# Patient Record
Sex: Female | Born: 1965 | Race: White | Hispanic: No | Marital: Married | Smoking: Never smoker
Health system: Southern US, Community
[De-identification: ages and names within clinical notes are randomized; demographics above are authoritative.]

## PROBLEM LIST (undated history)

## (undated) DIAGNOSIS — E785 Hyperlipidemia, unspecified: Secondary | ICD-10-CM

## (undated) DIAGNOSIS — I10 Essential (primary) hypertension: Secondary | ICD-10-CM

## (undated) DIAGNOSIS — N289 Disorder of kidney and ureter, unspecified: Secondary | ICD-10-CM

## (undated) DIAGNOSIS — F419 Anxiety disorder, unspecified: Secondary | ICD-10-CM

## (undated) HISTORY — PX: LITHOTRIPSY: SUR834

## (undated) HISTORY — DX: Hyperlipidemia, unspecified: E78.5

## (undated) HISTORY — DX: Anxiety disorder, unspecified: F41.9

## (undated) HISTORY — PX: BREAST REDUCTION SURGERY: SHX8

## (undated) HISTORY — PX: ENDOMETRIAL ABLATION: SHX621

## (undated) HISTORY — PX: NASAL SEPTUM SURGERY: SHX37

## (undated) HISTORY — PX: UPPER GASTROINTESTINAL ENDOSCOPY: SHX188

---

## 1998-06-21 ENCOUNTER — Inpatient Hospital Stay (HOSPITAL_COMMUNITY): Admission: AD | Admit: 1998-06-21 | Discharge: 1998-06-21 | Payer: Self-pay | Admitting: *Deleted

## 1998-06-25 ENCOUNTER — Observation Stay (HOSPITAL_COMMUNITY): Admission: AD | Admit: 1998-06-25 | Discharge: 1998-06-25 | Payer: Self-pay | Admitting: *Deleted

## 1998-08-01 ENCOUNTER — Inpatient Hospital Stay (HOSPITAL_COMMUNITY): Admission: AD | Admit: 1998-08-01 | Discharge: 1998-08-01 | Payer: Self-pay | Admitting: Obstetrics and Gynecology

## 1998-08-07 ENCOUNTER — Inpatient Hospital Stay (HOSPITAL_COMMUNITY): Admission: AD | Admit: 1998-08-07 | Discharge: 1998-08-07 | Payer: Self-pay | Admitting: Obstetrics and Gynecology

## 1998-08-24 ENCOUNTER — Inpatient Hospital Stay (HOSPITAL_COMMUNITY): Admission: AD | Admit: 1998-08-24 | Discharge: 1998-08-26 | Payer: Self-pay | Admitting: *Deleted

## 1998-09-28 ENCOUNTER — Other Ambulatory Visit: Admission: RE | Admit: 1998-09-28 | Discharge: 1998-09-28 | Payer: Self-pay | Admitting: Obstetrics and Gynecology

## 1999-10-18 ENCOUNTER — Other Ambulatory Visit: Admission: RE | Admit: 1999-10-18 | Discharge: 1999-10-18 | Payer: Self-pay | Admitting: Obstetrics and Gynecology

## 2000-04-19 ENCOUNTER — Encounter: Admission: RE | Admit: 2000-04-19 | Discharge: 2000-04-19 | Payer: Self-pay | Admitting: Oncology

## 2000-04-19 ENCOUNTER — Encounter (HOSPITAL_COMMUNITY): Payer: Self-pay | Admitting: Oncology

## 2000-10-21 ENCOUNTER — Other Ambulatory Visit: Admission: RE | Admit: 2000-10-21 | Discharge: 2000-10-21 | Payer: Self-pay | Admitting: Obstetrics and Gynecology

## 2000-10-31 ENCOUNTER — Encounter (HOSPITAL_COMMUNITY): Payer: Self-pay | Admitting: Oncology

## 2000-10-31 ENCOUNTER — Encounter: Admission: RE | Admit: 2000-10-31 | Discharge: 2000-10-31 | Payer: Self-pay | Admitting: Oncology

## 2001-02-11 ENCOUNTER — Other Ambulatory Visit: Admission: RE | Admit: 2001-02-11 | Discharge: 2001-02-11 | Payer: Self-pay | Admitting: Obstetrics and Gynecology

## 2001-02-11 ENCOUNTER — Encounter (INDEPENDENT_AMBULATORY_CARE_PROVIDER_SITE_OTHER): Payer: Self-pay | Admitting: Specialist

## 2001-06-18 ENCOUNTER — Other Ambulatory Visit: Admission: RE | Admit: 2001-06-18 | Discharge: 2001-06-18 | Payer: Self-pay | Admitting: Obstetrics and Gynecology

## 2001-07-28 ENCOUNTER — Encounter (INDEPENDENT_AMBULATORY_CARE_PROVIDER_SITE_OTHER): Payer: Self-pay | Admitting: *Deleted

## 2001-07-28 ENCOUNTER — Ambulatory Visit (HOSPITAL_BASED_OUTPATIENT_CLINIC_OR_DEPARTMENT_OTHER): Admission: RE | Admit: 2001-07-28 | Discharge: 2001-07-29 | Payer: Self-pay | Admitting: Specialist

## 2002-07-06 ENCOUNTER — Other Ambulatory Visit: Admission: RE | Admit: 2002-07-06 | Discharge: 2002-07-06 | Payer: Self-pay | Admitting: Obstetrics and Gynecology

## 2004-01-03 ENCOUNTER — Other Ambulatory Visit: Admission: RE | Admit: 2004-01-03 | Discharge: 2004-01-03 | Payer: Self-pay | Admitting: Obstetrics and Gynecology

## 2006-02-27 ENCOUNTER — Encounter: Admission: RE | Admit: 2006-02-27 | Discharge: 2006-02-27 | Payer: Self-pay | Admitting: Obstetrics and Gynecology

## 2009-06-24 ENCOUNTER — Encounter: Admission: RE | Admit: 2009-06-24 | Discharge: 2009-06-24 | Payer: Self-pay | Admitting: Obstetrics and Gynecology

## 2009-07-19 ENCOUNTER — Emergency Department (HOSPITAL_COMMUNITY): Admission: EM | Admit: 2009-07-19 | Discharge: 2009-07-19 | Payer: Self-pay | Admitting: Emergency Medicine

## 2009-08-17 ENCOUNTER — Emergency Department (HOSPITAL_COMMUNITY): Admission: EM | Admit: 2009-08-17 | Discharge: 2009-08-17 | Payer: Self-pay | Admitting: Emergency Medicine

## 2010-07-14 ENCOUNTER — Emergency Department (HOSPITAL_COMMUNITY): Admission: EM | Admit: 2010-07-14 | Discharge: 2010-07-14 | Payer: Self-pay | Admitting: Emergency Medicine

## 2011-01-06 ENCOUNTER — Encounter: Payer: Self-pay | Admitting: Obstetrics and Gynecology

## 2011-03-25 LAB — URINE MICROSCOPIC-ADD ON

## 2011-03-25 LAB — URINALYSIS, ROUTINE W REFLEX MICROSCOPIC
Glucose, UA: NEGATIVE mg/dL
Nitrite: NEGATIVE
Specific Gravity, Urine: 1.02 (ref 1.005–1.030)
pH: 6 (ref 5.0–8.0)

## 2011-03-25 LAB — PREGNANCY, URINE: Preg Test, Ur: NEGATIVE

## 2011-05-04 NOTE — Op Note (Signed)
[place]. [facility]  Patient:    Kayla Vazquez, Kayla Vazquez                       MRN: [id number] Adm. Date:  [id number] Attending:  Gustavus Messing                           Operative Report  HISTORY OF PRESENT ILLNESS:  This is a 45 year old lady who has severe macromastia, back and shoulder pain secondary to large pendulous breasts.  She has increased accessory breast tissue right and left sides with history of intertrigo.  PROCEDURES:  Bilateral breast reduction, reduction of the accessory breast tissue.  SURGEON:  Yaakov Guthrie. Shon Hough, M.D.  ASSISTANT:  Margaretha Sheffield, R.N.  ANESTHESIA:  General.  DESCRIPTION OF PROCEDURE:  The patient was set up and drawn for the inferior pedicle reduction mammoplasty remarking the nipple/areolar complexes back up to 20 cm from the suprasternal notch.  She then underwent general anesthesia, intubated orally.  Prep was done to the chest/breast areas in routine fashion using Betadine soap and solution, walled off with sterile towels, and draped so as to make a sterile field.  The wounds were scored with #15 blades down after the edges had been injected with Xylocaine 0.25%, 1:400,000 concentration, a total of 100 cc per side.  The skin over the inferior pedicel was de-epithelialized with a #20 blade.  Next, medial and lateral fatty and dermal pedicles were excised down the underlying fascia.  Hemostasis was maintained with a Bovie unit and coagulation.  Next, the new key hole area was debulked and laterally more breast tissue was removed, tremendous amounts of accessory breast tissue.  Over the serratus anterior, her chest was ______ upper axillary regions.  After proper hemostasis, the flaps were transposed and stayed with 3-0 Prolene.  Subcutaneous closure was done with 3-0 Monocryl x 2 layers, then run a subcuticular stitch with 3-0 Monocryl and 5-0 Monocryl throughout the inverted T.  The wounds were drained with #10  Blake drains, which were placed in the depths of the wound and brought up through the lateral portion of the incision and secured with 3-0 Prolene.  The wounds were cleansed.  Benzoin was applied and 1/2 inch Steri-Strips, Xeroform, 4 x 4s, ABDs, hyperfixed tape.  She was then taken to recovery in excellent condition.  ESTIMATED BLOOD LOSS:  Less than 100 cc.  COMPLICATIONS:  None. DD:  07/28/01 TD:  07/28/01 Job: [id number]

## 2011-07-27 ENCOUNTER — Other Ambulatory Visit: Payer: Self-pay | Admitting: Obstetrics and Gynecology

## 2011-07-27 DIAGNOSIS — Z1231 Encounter for screening mammogram for malignant neoplasm of breast: Secondary | ICD-10-CM

## 2011-08-16 ENCOUNTER — Ambulatory Visit (HOSPITAL_BASED_OUTPATIENT_CLINIC_OR_DEPARTMENT_OTHER)
Admission: RE | Admit: 2011-08-16 | Discharge: 2011-08-16 | Disposition: A | Discharge status: Home / Self Care | Payer: BC Managed Care – PPO | Source: Ambulatory Visit | Attending: Urology | Admitting: Urology

## 2011-08-16 DIAGNOSIS — N201 Calculus of ureter: Secondary | ICD-10-CM | POA: Insufficient documentation

## 2011-08-16 DIAGNOSIS — R310 Gross hematuria: Secondary | ICD-10-CM | POA: Insufficient documentation

## 2011-08-16 DIAGNOSIS — N1330 Unspecified hydronephrosis: Secondary | ICD-10-CM | POA: Insufficient documentation

## 2011-08-16 DIAGNOSIS — Z01812 Encounter for preprocedural laboratory examination: Secondary | ICD-10-CM | POA: Insufficient documentation

## 2011-08-16 DIAGNOSIS — R109 Unspecified abdominal pain: Secondary | ICD-10-CM | POA: Insufficient documentation

## 2011-08-17 ENCOUNTER — Ambulatory Visit
Admission: RE | Admit: 2011-08-17 | Discharge: 2011-08-17 | Disposition: A | Discharge status: Home / Self Care | Payer: BC Managed Care – PPO | Source: Ambulatory Visit | Attending: Obstetrics and Gynecology | Admitting: Obstetrics and Gynecology

## 2011-08-17 DIAGNOSIS — Z1231 Encounter for screening mammogram for malignant neoplasm of breast: Secondary | ICD-10-CM

## 2011-08-17 NOTE — Op Note (Signed)
  [name], Kayla Vazquez                ACCOUNT NO.:  [id number]  MEDICAL RECORD NO.:  [id number]  LOCATION:                                 FACILITY:  PHYSICIAN:  Sigmund I. Patsi Sears, M.D. DATE OF BIRTH:  DATE OF PROCEDURE:  08/16/2011 DATE OF DISCHARGE:                              OPERATIVE REPORT   PREOPERATIVE DIAGNOSIS:  Impacted right midureteral calculus.  POSTOPERATIVE DIAGNOSIS:  Impacted right midureteral calculus.  OPERATION:  Cystourethroscopy, right retrograde pyelogram with interpretation, laser right mid ureteral calculus, right double-J stent placement (6-French x 24 cm).  SURGEON:  Sigmund I. Patsi Sears, M.D.  ANESTHESIA:  General LMA.  PREPARATION:  After appropriate preanesthesia, the patient was brought to the operating room, placed on the operating room in dorsal supine position where general LMA anesthesia was induced.  She was re-placed in dorsal lithotomy position where the pubis was prepped with Betadine solution and draped in usual fashion.  The patient's right arm was marked appropriately, and the patient received IV antibiotic, as well as IV Tylenol prior to the surgery.  REVIEW OF HISTORY:  The patient is a 45 year old female with a history of nephrolithiasis, and recent gross hematuria, right flank pain.  CT scan shows a right midureteral calculus with hydronephrosis.  The stone measures 5.5 mm.  She has previously passed right distal stone.  PROCEDURE:  Cystourethroscopy was accomplished, right retrograde pyelograms was accomplished, and shows stone in the right midureter. The 6-French short ureteroscope was passed into the ureter, after guidewire was passed.  It is noted the retrograde pyelogram showed dilated mid and upper ureter.  The calyces appeared normal, but were not well defined on retrograde pyelogram.  There was no evidence of mass, or other stone.  The bladder itself showed a very, very small right ureteral orifice, which dilated  around the 6-French open-ended catheter. The trigone itself was normal, there was no evidence of bladder stone, tumor, or diverticular formation.  There is no bleeding noted. Guidewire was passed easily into the renal pelvis under fluoroscopic control, and right short ureteroscope was passed into the ureter.  This was passed above the vessels, and stone was identified.  The stone appeared to be multilobular, rectangular in formation, and photodocumentation was accomplished.  Using the laser with low energy, the stone was fragmented, without injury to the ureteral wall.  Because of manipulation of the ureter, because a long-lasting stone effect with pain and hydronephrosis, I  __________  6-French x 24-cm double-J catheter.  The ureteroscope was removed after fragments were extracted, and under fluoroscopic control, a 6-French x 24-cm double-J stent was passed into the right renal pelvis, and coiled in the bladder.  The patient was then awakened after given IV Toradol, and taken to the recovery room in good condition.  Sigmund I. Patsi Sears, M.D.     SIT/MEDQ  D:  08/16/2011  T:  08/16/2011  Job:  [id number]  Electronically Signed by Jethro Bolus M.D. on 08/17/2011 03:12:40 PM

## 2013-08-04 ENCOUNTER — Other Ambulatory Visit: Payer: Self-pay

## 2013-08-04 DIAGNOSIS — Z1231 Encounter for screening mammogram for malignant neoplasm of breast: Secondary | ICD-10-CM

## 2013-08-28 ENCOUNTER — Ambulatory Visit
Admission: RE | Admit: 2013-08-28 | Discharge: 2013-08-28 | Disposition: A | Discharge status: Home / Self Care | Payer: BC Managed Care – PPO | Source: Ambulatory Visit

## 2013-08-28 DIAGNOSIS — Z1231 Encounter for screening mammogram for malignant neoplasm of breast: Secondary | ICD-10-CM

## 2014-07-15 ENCOUNTER — Telehealth: Payer: Self-pay | Admitting: Family Medicine

## 2014-07-15 NOTE — Telephone Encounter (Signed)
Patient is transferring from Dr. Charm BargesButler and would like to establish care with Kayla PieriniMary Margaret Martin, FNP. She complains of hotflashes and fatigue and believes she may be starting menopause.  Appt scheduled. Patient aware.

## 2014-07-29 ENCOUNTER — Ambulatory Visit (INDEPENDENT_AMBULATORY_CARE_PROVIDER_SITE_OTHER): Payer: BC Managed Care – PPO | Admitting: Nurse Practitioner

## 2014-07-29 ENCOUNTER — Encounter: Payer: Self-pay | Admitting: Nurse Practitioner

## 2014-07-29 VITALS — BP 158/83 | HR 120 | Temp 97.6°F | Ht 62.0 in | Wt 185.6 lb

## 2014-07-29 DIAGNOSIS — N951 Menopausal and female climacteric states: Secondary | ICD-10-CM

## 2014-07-29 DIAGNOSIS — F411 Generalized anxiety disorder: Secondary | ICD-10-CM

## 2014-07-29 MED ORDER — CITALOPRAM HYDROBROMIDE 40 MG PO TABS: 40.0000 mg | ORAL_TABLET | Freq: Every day | ORAL | Status: DC

## 2014-07-29 MED ORDER — VENLAFAXINE HCL ER 75 MG PO CP24: 75.0000 mg | ORAL_CAPSULE | Freq: Every day | ORAL | Status: DC

## 2014-07-29 NOTE — Patient Instructions (Signed)
Stress and Stress Management Stress is a normal reaction to life events. It is what you feel when life demands more than you are used to or more than you can handle. Some stress can be useful. For example, the stress reaction can help you catch the last bus of the day, study for a test, or meet a deadline at work. But stress that occurs too often or for too long can cause problems. It can affect your emotional health and interfere with relationships and normal daily activities. Too much stress can weaken your immune system and increase your risk for physical illness. If you already have a medical problem, stress can make it worse. CAUSES  All sorts of life events may cause stress. An event that causes stress for one person may not be stressful for another person. Major life events commonly cause stress. These may be positive or negative. Examples include losing your job, moving into a new home, getting married, having a baby, or losing a loved one. Less obvious life events may also cause stress, especially if they occur day after day or in combination. Examples include working long hours, driving in traffic, caring for children, being in debt, or being in a difficult relationship. SIGNS AND SYMPTOMS Stress may cause emotional symptoms including, the following:  Anxiety. This is feeling worried, afraid, on edge, overwhelmed, or out of control.  Anger. This is feeling irritated or impatient.  Depression. This is feeling sad, down, helpless, or guilty.  Difficulty focusing, remembering, or making decisions. Stress may cause physical symptoms, including the following:   Aches and pains. These may affect your head, neck, back, stomach, or other areas of your body.  Tight muscles or clenched jaw.  Low energy or trouble sleeping. Stress may cause unhealthy behaviors, including the following:   Eating to feel better (overeating) or skipping meals.  Sleeping too little, too much, or both.  Working  too much or putting off tasks (procrastination).  Smoking, drinking alcohol, or using drugs to feel better. DIAGNOSIS  Stress is diagnosed through an assessment by your health care provider. Your health care provider will ask questions about your symptoms and any stressful life events.Your health care provider will also ask about your medical history and may order blood tests or other tests. Certain medical conditions and medicine can cause physical symptoms similar to stress. Mental illness can cause emotional symptoms and unhealthy behaviors similar to stress. Your health care provider may refer you to a mental health professional for further evaluation.  TREATMENT  Stress management is the recommended treatment for stress.The goals of stress management are reducing stressful life events and coping with stress in healthy ways.  Techniques for reducing stressful life events include the following:  Stress identification. Self-monitor for stress and identify what causes stress for you. These skills may help you to avoid some stressful events.  Time management. Set your priorities, keep a calendar of events, and learn to say "no." These tools can help you avoid making too many commitments. Techniques for coping with stress include the following:  Rethinking the problem. Try to think realistically about stressful events rather than ignoring them or overreacting. Try to find the positives in a stressful situation rather than focusing on the negatives.  Exercise. Physical exercise can release both physical and emotional tension. The key is to find a form of exercise you enjoy and do it regularly.  Relaxation techniques. These relax the body and mind. Examples include yoga, meditation, tai chi, biofeedback, deep  breathing, progressive muscle relaxation, listening to music, being out in nature, journaling, and other hobbies. Again, the key is to find one or more that you enjoy and can do  regularly.  Healthy lifestyle. Eat a balanced diet, get plenty of sleep, and do not smoke. Avoid using alcohol or drugs to relax.  Strong support network. Spend time with family, friends, or other people you enjoy being around.Express your feelings and talk things over with someone you trust. Counseling or talktherapy with a mental health professional may be helpful if you are having difficulty managing stress on your own. Medicine is typically not recommended for the treatment of stress.Talk to your health care provider if you think you need medicine for symptoms of stress. HOME CARE INSTRUCTIONS  Keep all follow-up visits as directed by your health care provider.  Take all medicines as directed by your health care provider. SEEK MEDICAL CARE IF:  Your symptoms get worse or you start having new symptoms.  You feel overwhelmed by your problems and can no longer manage them on your own. SEEK IMMEDIATE MEDICAL CARE IF:  You feel like hurting yourself or someone else. Document Released: 05/29/2001 Document Revised: 04/19/2014 Document Reviewed: 07/28/2013 Exit[employer] Patient Information 2015 [employer]. This information is not intended to replace advice given to you by your health care provider. Make sure you discuss any questions you have with your health care provider.  

## 2014-07-29 NOTE — Progress Notes (Signed)
   Subjective:    Patient ID: Kayla Vazquez, female    DOB: 26-Feb-1966, 48 y.o.   MRN: [id number]   Establishing care and experiencing hot flashes day and night x 2 months, stressed, and very emotional.  Tearful, and tired all the time.  Taking multiple vitamins daily. Weight gain over the past year.  Has not had a menstrual cycle since  ablation 3 years ago.  Anxiety Presents for initial visit. Onset was 1 to 6 months ago. The problem has been gradually worsening. Symptoms include decreased concentration, excessive worry, insomnia, irritability, malaise, nervous/anxious behavior and palpitations. Symptoms occur most days. The severity of symptoms is causing significant distress. The symptoms are aggravated by family issues. The quality of sleep is poor. Nighttime awakenings: several.   Past treatments include nothing.   * her daughter is having a real hard tome and has been in mental health before- mom is worrying all the time     Review of Systems  Constitutional: Positive for irritability, fatigue and unexpected weight change.  HENT: Negative.   Eyes: Negative.   Respiratory: Negative.   Cardiovascular: Positive for palpitations.  Endocrine: Positive for heat intolerance.  Genitourinary: Negative.   Musculoskeletal: Negative.   Skin: Negative.   Allergic/Immunologic: Negative.   Neurological: Negative.   Hematological: Negative.   Psychiatric/Behavioral: Positive for decreased concentration. The patient is nervous/anxious and has insomnia.        Objective:   Physical Exam  Constitutional: She is oriented to person, place, and time. She appears well-developed and well-nourished. She appears distressed.  Eyes: Conjunctivae and EOM are normal. Pupils are equal, round, and reactive to light.  Neck: Normal range of motion.  Cardiovascular: Normal rate, regular rhythm and normal heart sounds.   Abdominal: Soft. Normal appearance and bowel sounds are normal.  Musculoskeletal:  Normal range of motion.  Neurological: She is alert and oriented to person, place, and time. She has normal reflexes.  Skin: Skin is warm and dry.  Psychiatric: She has a normal mood and affect. Her behavior is normal. Judgment and thought content normal.  BP 158/83  Pulse 120  Temp(Src) 97.6 F (36.4 C) (Oral)  Ht 5\' 2"  (1.575 m)  Wt 185 lb 9.6 oz (84.188 kg)  BMI 33.94 kg/m2         Assessment & Plan:   1. GAD (generalized anxiety disorder)   2. Hot flashes, menopausal    Meds ordered this encounter  Medications  . DISCONTD: citalopram (CELEXA) 40 MG tablet    Sig: Take 1 tablet (40 mg total) by mouth daily.    Dispense:  30 tablet    Refill:  3    Order Specific Question:  Supervising Provider    Answer:  Ernestina PennaMOORE, DONALD W [[id number]]  . venlafaxine XR (EFFEXOR XR) 75 MG 24 hr capsule    Sig: Take 1 capsule (75 mg total) by mouth daily with breakfast.    Dispense:  30 capsule    Refill:  3    Do not fill celexa that was previously ordered    Order Specific Question:  Supervising Provider    Answer:  Ernestina PennaMOORE, DONALD W [[id number]]   Was originally going to do celexa- changed my mind during appointment- thought effexor would work with anxiety, depression and hot flashes Follow up in 1 month  Kayla Daphine DeutscherMartin, FNP

## 2014-08-30 ENCOUNTER — Encounter: Payer: Self-pay | Admitting: Nurse Practitioner

## 2014-08-30 ENCOUNTER — Ambulatory Visit (INDEPENDENT_AMBULATORY_CARE_PROVIDER_SITE_OTHER): Payer: BC Managed Care – PPO | Admitting: Nurse Practitioner

## 2014-08-30 VITALS — BP 138/82 | HR 92 | Temp 97.2°F | Ht 62.0 in | Wt 182.0 lb

## 2014-08-30 DIAGNOSIS — R82998 Other abnormal findings in urine: Secondary | ICD-10-CM

## 2014-08-30 DIAGNOSIS — F3289 Other specified depressive episodes: Secondary | ICD-10-CM

## 2014-08-30 DIAGNOSIS — F32A Depression, unspecified: Secondary | ICD-10-CM

## 2014-08-30 DIAGNOSIS — R8290 Unspecified abnormal findings in urine: Secondary | ICD-10-CM

## 2014-08-30 DIAGNOSIS — F411 Generalized anxiety disorder: Secondary | ICD-10-CM

## 2014-08-30 DIAGNOSIS — F329 Major depressive disorder, single episode, unspecified: Secondary | ICD-10-CM

## 2014-08-30 LAB — POCT UA - MICROSCOPIC ONLY
BACTERIA, U MICROSCOPIC: NEGATIVE
Casts, Ur, LPF, POC: NEGATIVE
Crystals, Ur, HPF, POC: NEGATIVE
Mucus, UA: NEGATIVE
RBC, URINE, MICROSCOPIC: NEGATIVE
Yeast, UA: NEGATIVE

## 2014-08-30 LAB — POCT URINALYSIS DIPSTICK
BILIRUBIN UA: NEGATIVE
Blood, UA: NEGATIVE
GLUCOSE UA: NEGATIVE
Ketones, UA: NEGATIVE
NITRITE UA: NEGATIVE
Protein, UA: NEGATIVE
Spec Grav, UA: 1.01
UROBILINOGEN UA: NEGATIVE
pH, UA: 6

## 2014-08-30 MED ORDER — VENLAFAXINE HCL ER 75 MG PO CP24: 75.0000 mg | ORAL_CAPSULE | Freq: Every day | ORAL | Status: DC

## 2014-08-30 NOTE — Progress Notes (Signed)
   Subjective:    Patient ID: Kayla Vazquez, female    DOB: 13-Jun-1966, 48 y.o.   MRN: [id number]  HPI Patient was seen about 1  Month ago with anxiety, depression and menopausal symptoms- we started her on effexor and it is working really well for her.  *urine has a foul odor.  Review of Systems  Constitutional: Negative.   HENT: Negative.   Respiratory: Negative.   Cardiovascular: Negative.   Gastrointestinal: Negative.   Genitourinary: Negative.   Neurological: Negative.   Psychiatric/Behavioral: Negative.   All other systems reviewed and are negative.      Objective:   Physical Exam  Constitutional: She is oriented to person, place, and time. She appears well-developed and well-nourished.  Eyes: Pupils are equal, round, and reactive to light.  Neck: Normal range of motion. Neck supple.  Cardiovascular: Normal rate, regular rhythm and normal heart sounds.   Pulmonary/Chest: Effort normal and breath sounds normal.  Abdominal: Soft. Bowel sounds are normal.  Neurological: She is alert and oriented to person, place, and time.  Skin: Skin is warm and dry.  Psychiatric: She has a normal mood and affect. Her behavior is normal. Judgment and thought content normal.    BP 138/82  Pulse 92  Temp(Src) 97.2 F (36.2 C) (Oral)  Ht  (1.575 m)  Wt 182 lb (82.555 kg)  BMI 33.28 kg/m2  Results for orders placed in visit on 08/30/14  POCT URINALYSIS DIPSTICK      Result Value Ref Range   Color, UA yellow     Clarity, UA clear     Glucose, UA neg     Bilirubin, UA neg     Ketones, UA neg     Spec Grav, UA 1.010     Blood, UA neg     pH, UA 6.0     Protein, UA neg     Urobilinogen, UA negative     Nitrite, UA neg     Leukocytes, UA Trace           Assessment & Plan:   1. Bad odor of urine   2. GAD (generalized anxiety disorder)   3. Depression   force fluids Urine clear Contiue effexor as rx Stress management Follow up in 6 months  Mary-Margaret Daphine Deutscher,  FNP

## 2014-08-30 NOTE — Patient Instructions (Signed)
Stress and Stress Management Stress is a normal reaction to life events. It is what you feel when life demands more than you are used to or more than you can handle. Some stress can be useful. For example, the stress reaction can help you catch the last bus of the day, study for a test, or meet a deadline at work. But stress that occurs too often or for too long can cause problems. It can affect your emotional health and interfere with relationships and normal daily activities. Too much stress can weaken your immune system and increase your risk for physical illness. If you already have a medical problem, stress can make it worse. CAUSES  All sorts of life events may cause stress. An event that causes stress for one person may not be stressful for another person. Major life events commonly cause stress. These may be positive or negative. Examples include losing your job, moving into a new home, getting married, having a baby, or losing a loved one. Less obvious life events may also cause stress, especially if they occur day after day or in combination. Examples include working long hours, driving in traffic, caring for children, being in debt, or being in a difficult relationship. SIGNS AND SYMPTOMS Stress may cause emotional symptoms including, the following:  Anxiety. This is feeling worried, afraid, on edge, overwhelmed, or out of control.  Anger. This is feeling irritated or impatient.  Depression. This is feeling sad, down, helpless, or guilty.  Difficulty focusing, remembering, or making decisions. Stress may cause physical symptoms, including the following:   Aches and pains. These may affect your head, neck, back, stomach, or other areas of your body.  Tight muscles or clenched jaw.  Low energy or trouble sleeping. Stress may cause unhealthy behaviors, including the following:   Eating to feel better (overeating) or skipping meals.  Sleeping too little, too much, or both.  Working  too much or putting off tasks (procrastination).  Smoking, drinking alcohol, or using drugs to feel better. DIAGNOSIS  Stress is diagnosed through an assessment by your health care provider. Your health care provider will ask questions about your symptoms and any stressful life events.Your health care provider will also ask about your medical history and may order blood tests or other tests. Certain medical conditions and medicine can cause physical symptoms similar to stress. Mental illness can cause emotional symptoms and unhealthy behaviors similar to stress. Your health care provider may refer you to a mental health professional for further evaluation.  TREATMENT  Stress management is the recommended treatment for stress.The goals of stress management are reducing stressful life events and coping with stress in healthy ways.  Techniques for reducing stressful life events include the following:  Stress identification. Self-monitor for stress and identify what causes stress for you. These skills may help you to avoid some stressful events.  Time management. Set your priorities, keep a calendar of events, and learn to say "no." These tools can help you avoid making too many commitments. Techniques for coping with stress include the following:  Rethinking the problem. Try to think realistically about stressful events rather than ignoring them or overreacting. Try to find the positives in a stressful situation rather than focusing on the negatives.  Exercise. Physical exercise can release both physical and emotional tension. The key is to find a form of exercise you enjoy and do it regularly.  Relaxation techniques. These relax the body and mind. Examples include yoga, meditation, tai chi, biofeedback, deep  breathing, progressive muscle relaxation, listening to music, being out in nature, journaling, and other hobbies. Again, the key is to find one or more that you enjoy and can do  regularly.  Healthy lifestyle. Eat a balanced diet, get plenty of sleep, and do not smoke. Avoid using alcohol or drugs to relax.  Strong support network. Spend time with family, friends, or other people you enjoy being around.Express your feelings and talk things over with someone you trust. Counseling or talktherapy with a mental health professional may be helpful if you are having difficulty managing stress on your own. Medicine is typically not recommended for the treatment of stress.Talk to your health care provider if you think you need medicine for symptoms of stress. HOME CARE INSTRUCTIONS  Keep all follow-up visits as directed by your health care provider.  Take all medicines as directed by your health care provider. SEEK MEDICAL CARE IF:  Your symptoms get worse or you start having new symptoms.  You feel overwhelmed by your problems and can no longer manage them on your own. SEEK IMMEDIATE MEDICAL CARE IF:  You feel like hurting yourself or someone else. Document Released: 05/29/2001 Document Revised: 04/19/2014 Document Reviewed: 07/28/2013 Exit[employer] Patient Information 2015 [employer]. This information is not intended to replace advice given to you by your health care provider. Make sure you discuss any questions you have with your health care provider.  

## 2014-10-11 ENCOUNTER — Encounter: Payer: Self-pay | Admitting: Family Medicine

## 2014-10-11 ENCOUNTER — Ambulatory Visit (INDEPENDENT_AMBULATORY_CARE_PROVIDER_SITE_OTHER): Payer: BC Managed Care – PPO | Admitting: Family Medicine

## 2014-10-11 VITALS — BP 147/88 | HR 92 | Temp 97.4°F | Ht 62.0 in | Wt 181.0 lb

## 2014-10-11 DIAGNOSIS — R059 Cough, unspecified: Secondary | ICD-10-CM

## 2014-10-11 DIAGNOSIS — J069 Acute upper respiratory infection, unspecified: Secondary | ICD-10-CM

## 2014-10-11 DIAGNOSIS — J029 Acute pharyngitis, unspecified: Secondary | ICD-10-CM

## 2014-10-11 DIAGNOSIS — R05 Cough: Secondary | ICD-10-CM

## 2014-10-11 LAB — POCT INFLUENZA A/B
Influenza A, POC: NEGATIVE
Influenza B, POC: NEGATIVE

## 2014-10-11 LAB — POCT RAPID STREP A (OFFICE): Rapid Strep A Screen: NEGATIVE

## 2014-10-11 MED ORDER — BENZONATATE 100 MG PO CAPS: 100.0000 mg | ORAL_CAPSULE | Freq: Three times a day (TID) | ORAL | Status: DC | PRN

## 2014-10-11 MED ORDER — AZITHROMYCIN 250 MG PO TABS: ORAL_TABLET | ORAL | Status: DC

## 2014-10-11 MED ORDER — METHYLPREDNISOLONE ACETATE 80 MG/ML IJ SUSP
80.0000 mg | Freq: Once | INTRAMUSCULAR | Status: AC
  Administered 2014-10-11: 80 mg via INTRAMUSCULAR

## 2014-10-11 NOTE — Progress Notes (Signed)
   Subjective:    Patient ID: Kayla Vazquez, female    DOB: 1966-11-02, 48 y.o.   MRN: [id number]  HPI Patient is here for URI complaints and sore throat.  Review of Systems  Constitutional: Negative for fever.  HENT: Negative for ear pain.   Eyes: Negative for discharge.  Respiratory: Negative for cough.   Cardiovascular: Negative for chest pain.  Gastrointestinal: Negative for abdominal distention.  Endocrine: Negative for polyuria.  Genitourinary: Negative for difficulty urinating.  Musculoskeletal: Negative for gait problem and neck pain.  Skin: Negative for color change and rash.  Neurological: Negative for speech difficulty and headaches.  Psychiatric/Behavioral: Negative for agitation.      Marland Kitchen.w Objective:    BP 147/88  Pulse 92  Temp(Src) 97.4 F (36.3 C) (Oral)  Ht 5\' 2"  (1.575 m)  Wt 181 lb (82.101 kg)  BMI 33.10 kg/m2 Physical Exam  Constitutional: She is oriented to person, place, and time. She appears well-developed and well-nourished.  HENT:  Head: Normocephalic and atraumatic.  Mouth/Throat: Oropharynx is clear and moist.  Eyes: Pupils are equal, round, and reactive to light.  Neck: Normal range of motion. Neck supple.  Cardiovascular: Normal rate and regular rhythm.   No murmur heard. Pulmonary/Chest: Effort normal and breath sounds normal.  Abdominal: Soft. Bowel sounds are normal. There is no tenderness.  Neurological: She is alert and oriented to person, place, and time.  Skin: Skin is warm and dry.  Psychiatric: She has a normal mood and affect.          Assessment & Plan:     ICD-9-CM ICD-10-CM   1. Cough 786.2 R05 POCT Influenza A/B     azithromycin (ZITHROMAX) 250 MG tablet     methylPREDNISolone acetate (DEPO-MEDROL) injection 80 mg     benzonatate (TESSALON PERLES) 100 MG capsule  2. Sore throat 462 J02.9 POCT rapid strep A     azithromycin (ZITHROMAX) 250 MG tablet     methylPREDNISolone acetate (DEPO-MEDROL) injection 80 mg  3.  URI (upper respiratory infection) 465.9 J06.9 azithromycin (ZITHROMAX) 250 MG tablet     methylPREDNISolone acetate (DEPO-MEDROL) injection 80 mg   Push po fluids, rest, tylenol and motrin otc prn as directed for fever, arthralgias, and myalgias.  Follow up prn if sx's continue or persist.  No Follow-up on file.  Deatra CanterWilliam J Cloria Ciresi FNP

## 2014-12-03 ENCOUNTER — Ambulatory Visit (INDEPENDENT_AMBULATORY_CARE_PROVIDER_SITE_OTHER): Payer: BC Managed Care – PPO | Admitting: Nurse Practitioner

## 2014-12-03 ENCOUNTER — Encounter: Payer: Self-pay | Admitting: Nurse Practitioner

## 2014-12-03 VITALS — BP 138/88 | HR 98 | Temp 97.1°F | Ht 62.0 in | Wt 181.0 lb

## 2014-12-03 DIAGNOSIS — N951 Menopausal and female climacteric states: Secondary | ICD-10-CM

## 2014-12-03 DIAGNOSIS — F329 Major depressive disorder, single episode, unspecified: Secondary | ICD-10-CM

## 2014-12-03 DIAGNOSIS — F32A Depression, unspecified: Secondary | ICD-10-CM

## 2014-12-03 MED ORDER — VENLAFAXINE HCL ER 150 MG PO CP24: 150.0000 mg | ORAL_CAPSULE | Freq: Every day | ORAL | Status: DC

## 2014-12-03 NOTE — Progress Notes (Signed)
   Subjective:    Patient ID: Kayla Vazquez, female    DOB: 1966/04/22, 48 y.o.   MRN: [id number]  HPI  Patient in today for follow up of depression and anxiety. She is currently on effexor XR 75mg - helping a lot with anxiety but she is having lots of hot flashes. Would like to increase effexor to see if will help more.    Review of Systems  Constitutional: Negative.   HENT: Negative.   Respiratory: Negative.   Cardiovascular: Negative.   Genitourinary: Negative.   Neurological: Negative.   Psychiatric/Behavioral: Negative.   All other systems reviewed and are negative.      Objective:   Physical Exam  Constitutional: She is oriented to person, place, and time. She appears well-developed and well-nourished.  Cardiovascular: Normal rate, regular rhythm and normal heart sounds.   Pulmonary/Chest: Effort normal and breath sounds normal.  Abdominal: Soft. Bowel sounds are normal.  Neurological: She is alert and oriented to person, place, and time.  Skin: Skin is warm and dry.  Psychiatric: She has a normal mood and affect. Her behavior is normal. Judgment and thought content normal.   BP 138/88 mmHg  Pulse 98  Temp(Src) 97.1 F (36.2 C) (Oral)  Ht 5\' 2"  (1.575 m)  Wt 181 lb (82.101 kg)  BMI 33.10 kg/m2         Assessment & Plan:   1. Depression   2. Hot flashes, menopausal    Meds ordered this encounter  Medications  . venlafaxine XR (EFFEXOR XR) 150 MG 24 hr capsule    Sig: Take 1 capsule (150 mg total) by mouth daily with breakfast.    Dispense:  30 capsule    Refill:  2    Order Specific Question:  Supervising Provider    Answer:  Deborra MedinaMOORE, DONALD W [[id number]]  increased effexor to 150mg  form 75mg . Stress management Follow up in 3 months  Mary-Margaret Daphine DeutscherMartin, FNP

## 2014-12-03 NOTE — Patient Instructions (Signed)
Stress and Stress Management Stress is a normal reaction to life events. It is what you feel when life demands more than you are used to or more than you can handle. Some stress can be useful. For example, the stress reaction can help you catch the last bus of the day, study for a test, or meet a deadline at work. But stress that occurs too often or for too long can cause problems. It can affect your emotional health and interfere with relationships and normal daily activities. Too much stress can weaken your immune system and increase your risk for physical illness. If you already have a medical problem, stress can make it worse. CAUSES  All sorts of life events may cause stress. An event that causes stress for one person may not be stressful for another person. Major life events commonly cause stress. These may be positive or negative. Examples include losing your job, moving into a new home, getting married, having a baby, or losing a loved one. Less obvious life events may also cause stress, especially if they occur day after day or in combination. Examples include working long hours, driving in traffic, caring for children, being in debt, or being in a difficult relationship. SIGNS AND SYMPTOMS Stress may cause emotional symptoms including, the following:  Anxiety. This is feeling worried, afraid, on edge, overwhelmed, or out of control.  Anger. This is feeling irritated or impatient.  Depression. This is feeling sad, down, helpless, or guilty.  Difficulty focusing, remembering, or making decisions. Stress may cause physical symptoms, including the following:   Aches and pains. These may affect your head, neck, back, stomach, or other areas of your body.  Tight muscles or clenched jaw.  Low energy or trouble sleeping. Stress may cause unhealthy behaviors, including the following:   Eating to feel better (overeating) or skipping meals.  Sleeping too little, too much, or both.  Working  too much or putting off tasks (procrastination).  Smoking, drinking alcohol, or using drugs to feel better. DIAGNOSIS  Stress is diagnosed through an assessment by your health care provider. Your health care provider will ask questions about your symptoms and any stressful life events.Your health care provider will also ask about your medical history and may order blood tests or other tests. Certain medical conditions and medicine can cause physical symptoms similar to stress. Mental illness can cause emotional symptoms and unhealthy behaviors similar to stress. Your health care provider may refer you to a mental health professional for further evaluation.  TREATMENT  Stress management is the recommended treatment for stress.The goals of stress management are reducing stressful life events and coping with stress in healthy ways.  Techniques for reducing stressful life events include the following:  Stress identification. Self-monitor for stress and identify what causes stress for you. These skills may help you to avoid some stressful events.  Time management. Set your priorities, keep a calendar of events, and learn to say "no." These tools can help you avoid making too many commitments. Techniques for coping with stress include the following:  Rethinking the problem. Try to think realistically about stressful events rather than ignoring them or overreacting. Try to find the positives in a stressful situation rather than focusing on the negatives.  Exercise. Physical exercise can release both physical and emotional tension. The key is to find a form of exercise you enjoy and do it regularly.  Relaxation techniques. These relax the body and mind. Examples include yoga, meditation, tai chi, biofeedback, deep  breathing, progressive muscle relaxation, listening to music, being out in nature, journaling, and other hobbies. Again, the key is to find one or more that you enjoy and can do  regularly.  Healthy lifestyle. Eat a balanced diet, get plenty of sleep, and do not smoke. Avoid using alcohol or drugs to relax.  Strong support network. Spend time with family, friends, or other people you enjoy being around.Express your feelings and talk things over with someone you trust. Counseling or talktherapy with a mental health professional may be helpful if you are having difficulty managing stress on your own. Medicine is typically not recommended for the treatment of stress.Talk to your health care provider if you think you need medicine for symptoms of stress. HOME CARE INSTRUCTIONS  Keep all follow-up visits as directed by your health care provider.  Take all medicines as directed by your health care provider. SEEK MEDICAL CARE IF:  Your symptoms get worse or you start having new symptoms.  You feel overwhelmed by your problems and can no longer manage them on your own. SEEK IMMEDIATE MEDICAL CARE IF:  You feel like hurting yourself or someone else. Document Released: 05/29/2001 Document Revised: 04/19/2014 Document Reviewed: 07/28/2013 Exit[employer] Patient Information 2015 [employer]. This information is not intended to replace advice given to you by your health care provider. Make sure you discuss any questions you have with your health care provider.  

## 2014-12-06 ENCOUNTER — Other Ambulatory Visit: Payer: Self-pay | Admitting: *Deleted

## 2014-12-06 MED ORDER — VENLAFAXINE HCL ER 150 MG PO CP24: 150.0000 mg | ORAL_CAPSULE | Freq: Every day | ORAL | Status: DC

## 2014-12-06 NOTE — Telephone Encounter (Signed)
[facility], MMM"s failed to send

## 2015-03-14 ENCOUNTER — Other Ambulatory Visit: Payer: Self-pay | Admitting: Nurse Practitioner

## 2015-06-06 ENCOUNTER — Ambulatory Visit (INDEPENDENT_AMBULATORY_CARE_PROVIDER_SITE_OTHER): Payer: BLUE CROSS/BLUE SHIELD | Admitting: Nurse Practitioner

## 2015-06-06 ENCOUNTER — Encounter (INDEPENDENT_AMBULATORY_CARE_PROVIDER_SITE_OTHER): Payer: Self-pay

## 2015-06-06 ENCOUNTER — Encounter: Payer: Self-pay | Admitting: Nurse Practitioner

## 2015-06-06 VITALS — BP 152/91 | HR 106 | Temp 97.9°F | Ht 62.0 in | Wt 185.0 lb

## 2015-06-06 DIAGNOSIS — F411 Generalized anxiety disorder: Secondary | ICD-10-CM | POA: Diagnosis not present

## 2015-06-06 DIAGNOSIS — F32A Depression, unspecified: Secondary | ICD-10-CM

## 2015-06-06 DIAGNOSIS — I10 Essential (primary) hypertension: Secondary | ICD-10-CM

## 2015-06-06 DIAGNOSIS — F329 Major depressive disorder, single episode, unspecified: Secondary | ICD-10-CM | POA: Diagnosis not present

## 2015-06-06 MED ORDER — VENLAFAXINE HCL ER 150 MG PO CP24: ORAL_CAPSULE | ORAL | Status: DC

## 2015-06-06 MED ORDER — LISINOPRIL 20 MG PO TABS: 20.0000 mg | ORAL_TABLET | Freq: Every day | ORAL | Status: DC

## 2015-06-06 NOTE — Progress Notes (Signed)
   Subjective:    Patient ID: ANITZA AMANTE, female    DOB: 1966/07/17, 49 y.o.   MRN: [id number]  HPI   Patient in today for follow up of depression and anxiety. She is currently on effexor XR 150mg - helping a lot with anxiety. SHe is doing well today without complaints. SHe has not had a physical in awhile.  Review of Systems  Constitutional: Negative.   HENT: Negative.   Respiratory: Negative.   Cardiovascular: Negative.   Genitourinary: Negative.   Neurological: Negative.   Psychiatric/Behavioral: Negative.   All other systems reviewed and are negative.      Objective:   Physical Exam  Constitutional: She is oriented to person, place, and time. She appears well-developed and well-nourished.  Cardiovascular: Normal rate, regular rhythm and normal heart sounds.   Pulmonary/Chest: Effort normal and breath sounds normal.  Abdominal: Soft. Bowel sounds are normal.  Neurological: She is alert and oriented to person, place, and time.  Skin: Skin is warm and dry.  Psychiatric: She has a normal mood and affect. Her behavior is normal. Judgment and thought content normal.    BP 152/91 mmHg  Pulse 106  Temp(Src) 97.9 F (36.6 C) (Oral)  Ht 5\' 2"  (1.575 m)  Wt 185 lb (83.915 kg)  BMI 33.83 kg/m2      Assessment & Plan:  1. GAD (generalized anxiety disorder) Stress management  2. Depression - venlafaxine XR (EFFEXOR-XR) 150 MG 24 hr capsule; TAKE ONE (1) CAPSULE EACH DAY  Dispense: 90 capsule; Refill: 1  3. Essential hypertension Do not add salt to diet - lisinopril (PRINIVIL,ZESTRIL) 20 MG tablet; Take 1 tablet (20 mg total) by mouth daily.  Dispense: 90 tablet; Refill: 1   Needs appointment for physical and to recheck blood pressure Diet and exercise encouraged Continue all meds Follow up  In 2 weeks   Mary-Margaret Daphine Deutscher, FNP

## 2015-06-06 NOTE — Addendum Note (Signed)
Addended by: Bennie Pierini on: 06/06/2015 04:41 PM   Modules accepted: Orders

## 2015-06-06 NOTE — Patient Instructions (Signed)

## 2015-06-21 ENCOUNTER — Ambulatory Visit (INDEPENDENT_AMBULATORY_CARE_PROVIDER_SITE_OTHER): Payer: BLUE CROSS/BLUE SHIELD | Admitting: Nurse Practitioner

## 2015-06-21 ENCOUNTER — Encounter: Payer: Self-pay | Admitting: Nurse Practitioner

## 2015-06-21 VITALS — BP 131/99 | HR 100 | Temp 97.1°F | Ht 62.0 in | Wt 181.0 lb

## 2015-06-21 DIAGNOSIS — I10 Essential (primary) hypertension: Secondary | ICD-10-CM

## 2015-06-21 DIAGNOSIS — R079 Chest pain, unspecified: Secondary | ICD-10-CM

## 2015-06-21 NOTE — Progress Notes (Signed)
   Subjective:    Patient ID: Kayla Vazquez, female    DOB: [birth year], 49 y.o.   MRN: [id number]  HPI Patient was seen 06/06/15 for recheck of anxiety. While here her blood pressure was elevated and we started her on lisinopril $RemoveBefor'20mg'nZDAbYzhHaUV$  daily. SHe is doing well from medication.No side effects. Patient does not check blood pressure at home. * has had a heavy feeling in her chest- thinks it is from anxiety- Some SOB when she is outside in the heat.   Review of Systems  Constitutional: Negative.   HENT: Negative.   Respiratory: Negative.   Cardiovascular: Negative.   Genitourinary: Negative.   Neurological: Negative.   Psychiatric/Behavioral: Negative.   All other systems reviewed and are negative.      Objective:   Physical Exam  Constitutional: She is oriented to person, place, and time. She appears well-developed and well-nourished.  HENT:  Right Ear: External ear normal.  Nose: Nose normal.  Mouth/Throat: Oropharynx is clear and moist.  Eyes: EOM are normal.  Neck: Trachea normal, normal range of motion and full passive range of motion without pain. Neck supple. No JVD present. Carotid bruit is not present. No thyromegaly present.  Cardiovascular: Normal rate, regular rhythm, normal heart sounds and intact distal pulses.  Exam reveals no gallop and no friction rub.   No murmur heard. Pulmonary/Chest: Effort normal and breath sounds normal.  Abdominal: Soft. Bowel sounds are normal. She exhibits no distension and no mass. There is no tenderness.  Musculoskeletal: Normal range of motion.  Lymphadenopathy:    She has no cervical adenopathy.  Neurological: She is alert and oriented to person, place, and time. She has normal reflexes.  Skin: Skin is warm and dry.  Psychiatric: She has a normal mood and affect. Her behavior is normal. Judgment and thought content normal.    BP 131/99 mmHg  Pulse 100  Temp(Src) 97.1 F (36.2 C) (Oral)  Ht $R'5\' 2"'rx$  (1.575 m)  Wt 181 lb (82.101 kg)   BMI 33.10 kg/m2   EKG- Kerry Hough, FNP     Assessment & Plan:  1. Essential hypertension Continue lisinopril as rx - CMP14+EGFR - Lipid panel  2. Chest pain, unspecified chest pain type Stress amanegement - EKG 12-Lead  Mary-Margaret Hassell Done, FNP

## 2015-06-21 NOTE — Patient Instructions (Addendum)
Stress and Stress Management Stress is a normal reaction to life events. It is what you feel when life demands more than you are used to or more than you can handle. Some stress can be useful. For example, the stress reaction can help you catch the last bus of the day, study for a test, or meet a deadline at work. But stress that occurs too often or for too long can cause problems. It can affect your emotional health and interfere with relationships and normal daily activities. Too much stress can weaken your immune system and increase your risk for physical illness. If you already have a medical problem, stress can make it worse. CAUSES  All sorts of life events may cause stress. An event that causes stress for one person may not be stressful for another person. Major life events commonly cause stress. These may be positive or negative. Examples include losing your job, moving into a new home, getting married, having a baby, or losing a loved one. Less obvious life events may also cause stress, especially if they occur day after day or in combination. Examples include working long hours, driving in traffic, caring for children, being in debt, or being in a difficult relationship. SIGNS AND SYMPTOMS Stress may cause emotional symptoms including, the following:  Anxiety. This is feeling worried, afraid, on edge, overwhelmed, or out of control.  Anger. This is feeling irritated or impatient.  Depression. This is feeling sad, down, helpless, or guilty.  Difficulty focusing, remembering, or making decisions. Stress may cause physical symptoms, including the following:   Aches and pains. These may affect your head, neck, back, stomach, or other areas of your body.  Tight muscles or clenched jaw.  Low energy or trouble sleeping. Stress may cause unhealthy behaviors, including the following:   Eating to feel better (overeating) or skipping meals.  Sleeping too little, too much, or both.  Working  too much or putting off tasks (procrastination).  Smoking, drinking alcohol, or using drugs to feel better. DIAGNOSIS  Stress is diagnosed through an assessment by your health care provider. Your health care provider will ask questions about your symptoms and any stressful life events.Your health care provider will also ask about your medical history and may order blood tests or other tests. Certain medical conditions and medicine can cause physical symptoms similar to stress. Mental illness can cause emotional symptoms and unhealthy behaviors similar to stress. Your health care provider may refer you to a mental health professional for further evaluation.  TREATMENT  Stress management is the recommended treatment for stress.The goals of stress management are reducing stressful life events and coping with stress in healthy ways.  Techniques for reducing stressful life events include the following:  Stress identification. Self-monitor for stress and identify what causes stress for you. These skills may help you to avoid some stressful events.  Time management. Set your priorities, keep a calendar of events, and learn to say "no." These tools can help you avoid making too many commitments. Techniques for coping with stress include the following:  Rethinking the problem. Try to think realistically about stressful events rather than ignoring them or overreacting. Try to find the positives in a stressful situation rather than focusing on the negatives.  Exercise. Physical exercise can release both physical and emotional tension. The key is to find a form of exercise you enjoy and do it regularly.  Relaxation techniques. These relax the body and mind. Examples include yoga, meditation, tai chi, biofeedback, deep  breathing, progressive muscle relaxation, listening to music, being out in nature, journaling, and other hobbies. Again, the key is to find one or more that you enjoy and can do  regularly.  Healthy lifestyle. Eat a balanced diet, get plenty of sleep, and do not smoke. Avoid using alcohol or drugs to relax.  Strong support network. Spend time with family, friends, or other people you enjoy being around.Express your feelings and talk things over with someone you trust. Counseling or talktherapy with a mental health professional may be helpful if you are having difficulty managing stress on your own. Medicine is typically not recommended for the treatment of stress.Talk to your health care provider if you think you need medicine for symptoms of stress. HOME CARE INSTRUCTIONS  Keep all follow-up visits as directed by your health care provider.  Take all medicines as directed by your health care provider. SEEK MEDICAL CARE IF:  Your symptoms get worse or you start having new symptoms.  You feel overwhelmed by your problems and can no longer manage them on your own. SEEK IMMEDIATE MEDICAL CARE IF:  You feel like hurting yourself or someone else. Document Released: 05/29/2001 Document Revised: 04/19/2014 Document Reviewed: 07/28/2013 Exit[employer] Patient Information 2015 [employer]. This information is not intended to replace advice given to you by your health care provider. Make sure you discuss any questions you have with your health care provider.   

## 2015-06-22 ENCOUNTER — Other Ambulatory Visit: Payer: Self-pay | Admitting: Family

## 2015-06-22 LAB — CMP14+EGFR
A/G RATIO: 2.1 (ref 1.1–2.5)
ALK PHOS: 103 IU/L (ref 39–117)
ALT: 47 IU/L — ABNORMAL HIGH (ref 0–32)
AST: 34 IU/L (ref 0–40)
Albumin: 4.7 g/dL (ref 3.5–5.5)
BUN/Creatinine Ratio: 11 (ref 9–23)
BUN: 10 mg/dL (ref 6–24)
Bilirubin Total: 0.6 mg/dL (ref 0.0–1.2)
CALCIUM: 10.8 mg/dL — AB (ref 8.7–10.2)
CO2: 24 mmol/L (ref 18–29)
Chloride: 100 mmol/L (ref 97–108)
Creatinine, Ser: 0.9 mg/dL (ref 0.57–1.00)
GFR calc Af Amer: 86 mL/min/{1.73_m2} (ref >59)
GFR calc non Af Amer: 75 mL/min/{1.73_m2} (ref >59)
GLUCOSE: 110 mg/dL — AB (ref 65–99)
Globulin, Total: 2.2 g/dL (ref 1.5–4.5)
Potassium: 4.7 mmol/L (ref 3.5–5.2)
Sodium: 139 mmol/L (ref 134–144)
Total Protein: 6.9 g/dL (ref 6.0–8.5)

## 2015-06-22 LAB — LIPID PANEL
Chol/HDL Ratio: 6.6 ratio units — ABNORMAL HIGH (ref 0.0–4.4)
Cholesterol, Total: 219 mg/dL — ABNORMAL HIGH (ref 100–199)
HDL: 33 mg/dL — AB (ref >39)
LDL Calculated: 112 mg/dL — ABNORMAL HIGH (ref 0–99)
Triglycerides: 371 mg/dL — ABNORMAL HIGH (ref 0–149)
VLDL CHOLESTEROL CAL: 74 mg/dL — AB (ref 5–40)

## 2015-06-22 MED ORDER — ATORVASTATIN CALCIUM 40 MG PO TABS: 40.0000 mg | ORAL_TABLET | Freq: Every day | ORAL | Status: DC

## 2015-07-21 ENCOUNTER — Other Ambulatory Visit: Payer: BLUE CROSS/BLUE SHIELD | Admitting: Nurse Practitioner

## 2015-11-23 ENCOUNTER — Other Ambulatory Visit: Payer: Self-pay | Admitting: Nurse Practitioner

## 2015-11-24 NOTE — Telephone Encounter (Signed)
Last refill without being seen 

## 2015-11-24 NOTE — Telephone Encounter (Signed)
Patient aware that she must be seen for any further refills  

## 2015-11-24 NOTE — Telephone Encounter (Signed)
Last seen 06/21/15  MMM

## 2016-02-07 ENCOUNTER — Emergency Department (HOSPITAL_COMMUNITY)
Admission: EM | Admit: 2016-02-07 | Discharge: 2016-02-07 | Disposition: A | Discharge status: Home / Self Care | Payer: BLUE CROSS/BLUE SHIELD | Attending: Emergency Medicine | Admitting: Emergency Medicine

## 2016-02-07 ENCOUNTER — Encounter (HOSPITAL_COMMUNITY): Payer: Self-pay | Admitting: *Deleted

## 2016-02-07 ENCOUNTER — Emergency Department (HOSPITAL_COMMUNITY): Payer: BLUE CROSS/BLUE SHIELD

## 2016-02-07 DIAGNOSIS — N201 Calculus of ureter: Secondary | ICD-10-CM | POA: Insufficient documentation

## 2016-02-07 DIAGNOSIS — Z79899 Other long term (current) drug therapy: Secondary | ICD-10-CM | POA: Diagnosis not present

## 2016-02-07 DIAGNOSIS — I10 Essential (primary) hypertension: Secondary | ICD-10-CM | POA: Insufficient documentation

## 2016-02-07 DIAGNOSIS — R231 Pallor: Secondary | ICD-10-CM | POA: Insufficient documentation

## 2016-02-07 DIAGNOSIS — Z9889 Other specified postprocedural states: Secondary | ICD-10-CM | POA: Insufficient documentation

## 2016-02-07 DIAGNOSIS — R109 Unspecified abdominal pain: Secondary | ICD-10-CM | POA: Diagnosis present

## 2016-02-07 DIAGNOSIS — Z79818 Long term (current) use of other agents affecting estrogen receptors and estrogen levels: Secondary | ICD-10-CM | POA: Insufficient documentation

## 2016-02-07 DIAGNOSIS — Z880 Allergy status to penicillin: Secondary | ICD-10-CM | POA: Insufficient documentation

## 2016-02-07 HISTORY — DX: Essential (primary) hypertension: I10

## 2016-02-07 LAB — URINALYSIS, ROUTINE W REFLEX MICROSCOPIC
BILIRUBIN URINE: NEGATIVE
Glucose, UA: NEGATIVE mg/dL
KETONES UR: 15 mg/dL — AB
Leukocytes, UA: NEGATIVE
NITRITE: NEGATIVE
Protein, ur: NEGATIVE mg/dL
Specific Gravity, Urine: 1.015 (ref 1.005–1.030)
pH: 6.5 (ref 5.0–8.0)

## 2016-02-07 LAB — URINE MICROSCOPIC-ADD ON

## 2016-02-07 MED ORDER — KETOROLAC TROMETHAMINE 30 MG/ML IJ SOLN
30.0000 mg | Freq: Once | INTRAMUSCULAR | Status: AC
  Administered 2016-02-07: 30 mg via INTRAVENOUS
  Filled 2016-02-07: qty 1

## 2016-02-07 MED ORDER — OXYCODONE-ACETAMINOPHEN 5-325 MG PO TABS: ORAL_TABLET | ORAL | Status: DC

## 2016-02-07 MED ORDER — TAMSULOSIN HCL 0.4 MG PO CAPS: ORAL_CAPSULE | ORAL | Status: DC

## 2016-02-07 MED ORDER — ONDANSETRON 4 MG PO TBDP: ORAL_TABLET | ORAL | Status: DC

## 2016-02-07 MED ORDER — NAPROXEN 500 MG PO TABS: ORAL_TABLET | ORAL | Status: DC

## 2016-02-07 MED ORDER — ONDANSETRON HCL 4 MG/2ML IJ SOLN
4.0000 mg | Freq: Once | INTRAMUSCULAR | Status: AC
  Administered 2016-02-07: 4 mg via INTRAVENOUS
  Filled 2016-02-07: qty 2

## 2016-02-07 MED ORDER — SODIUM CHLORIDE 0.9 % IV SOLN
INTRAVENOUS | Status: DC
  Administered 2016-02-07: 02:00:00 via INTRAVENOUS

## 2016-02-07 MED ORDER — HYDROMORPHONE HCL 1 MG/ML IJ SOLN
1.0000 mg | Freq: Once | INTRAMUSCULAR | Status: AC
  Administered 2016-02-07: 1 mg via INTRAVENOUS
  Filled 2016-02-07: qty 1

## 2016-02-07 NOTE — ED Provider Notes (Signed)
CSN: [id number]     Arrival date & time 02/07/16  0044 History   First MD Initiated Contact with Patient 02/07/16 0205   Chief Complaint  Patient presents with  . Flank Pain     (Consider location/radiation/quality/duration/timing/severity/associated sxs/prior Treatment) HPI patient reports she has a history of renal stones. She states the last time she passed a stone she had to have lithotripsy and that was the about 7 years ago. She states about 11 PM tonight she started having left flank pain that is now radiating towards her left mid abdomen. She has had nausea and vomiting. She states she feels like she has to urinate but she can't. She denies hematuria. She denies any fever. She states she has cut out caffeine products and does not ingest a lot of milk products.   [facility]  Past Medical History  Diagnosis Date  . Hypertension    Past Surgical History  Procedure Laterality Date  . Breast reduction surgery    . Nasal septum surgery    . Endometrial ablation    . Lithotripsy     Family History  Problem Relation Age of Onset  . Cancer Mother     Breast   Social History  Substance Use Topics  . Smoking status: Never Smoker   . Smokeless tobacco: None  . Alcohol Use: Yes     Comment: rare   employed  OB History    No data available     Review of Systems  All other systems reviewed and are negative.     Allergies  Demerol and Penicillins  Home Medications   Prior to Admission medications   Medication Sig Start Date End Date Taking? Authorizing Provider  estrogens conjugated, synthetic A, (CENESTIN) 0.3 MG tablet Take 0.3 mg by mouth daily.   Yes Historical Provider, MD  lisinopril (PRINIVIL,ZESTRIL) 20 MG tablet Take 1 tablet (20 mg total) by mouth daily. 06/06/15   Mary-Margaret Daphine Deutscher, FNP  naproxen (NAPROSYN) 500 MG tablet Take 1 po BID with food prn pain 02/07/16   Devoria Albe, MD  ondansetron (ZOFRAN ODT) 4 MG  disintegrating tablet Place 1 on the tongue every 8 hours as needed for nausea or vomiting 02/07/16   Devoria Albe, MD  oxyCODONE-acetaminophen (PERCOCET/ROXICET) 5-325 MG tablet Take 1 or 2 po Q 6hrs for pain 02/07/16   Devoria Albe, MD  tamsulosin (FLOMAX) 0.4 MG CAPS capsule Take 1 po QD until you pass the stone. 02/07/16   Devoria Albe, MD  venlafaxine XR (EFFEXOR-XR) 150 MG 24 hr capsule TAKE 1 CAPSULE DAILY 11/24/15   Mary-Margaret Daphine Deutscher, FNP   BP 164/91 mmHg  Pulse 87  Temp(Src) 98.4 F (36.9 C) (Oral)  Resp 18  Ht  (1.575 m)  Wt 162 lb (73.483 kg)  BMI 29.62 kg/m2  SpO2 94%  Vital signs normal except for hypertension  Physical Exam  Constitutional: She is oriented to person, place, and time. She appears well-developed and well-nourished.  Non-toxic appearance. She does not appear ill. She appears distressed.  Seems to have trouble sitting still  HENT:  Head: Normocephalic and atraumatic.  Right Ear: External ear normal.  Left Ear: External ear normal.  Nose: Nose normal. No mucosal edema or rhinorrhea.  Mouth/Throat: Oropharynx is clear and moist and mucous membranes are normal. No dental abscesses or uvula swelling.  Eyes: Conjunctivae and EOM are normal. Pupils are equal, round, and reactive to light.  Neck: Normal range of motion and  full passive range of motion without pain. Neck supple.  Cardiovascular: Normal rate, regular rhythm and normal heart sounds.  Exam reveals no gallop and no friction rub.   No murmur heard. Pulmonary/Chest: Effort normal and breath sounds normal. No respiratory distress. She has no wheezes. She has no rhonchi. She has no rales. She exhibits no tenderness and no crepitus.  Abdominal: Soft. Normal appearance and bowel sounds are normal. She exhibits no distension. There is no tenderness. There is no rebound and no guarding.  Patient has no pain to palpation but indicates she has pain in her left flank it in her left lower abdomen  Musculoskeletal:  Normal range of motion. She exhibits no edema or tenderness.  Moves all extremities well.   Neurological: She is alert and oriented to person, place, and time. She has normal strength. No cranial nerve deficit.  Skin: Skin is warm, dry and intact. No rash noted. No erythema. There is pallor.  Psychiatric: She has a normal mood and affect. Her speech is normal and behavior is normal. Her mood appears not anxious.  Nursing note and vitals reviewed.   ED Course  Procedures (including critical care time)  Medications  0.9 %  sodium chloride infusion ( Intravenous New Bag/Given 02/07/16 0226)  ondansetron (ZOFRAN) injection 4 mg (4 mg Intravenous Given 02/07/16 0224)  HYDROmorphone (DILAUDID) injection 1 mg (1 mg Intravenous Given 02/07/16 0224)  ketorolac (TORADOL) 30 MG/ML injection 30 mg (30 mg Intravenous Given 02/07/16 0350)   Patient gives the appearance of having a kidney stone also has a history of renal stones. She was given IV nausea and narcotic pain medication and CT renal scan was ordered. Review of her last AP CT scan showed that she had bilateral renal stones in 2010.  Bladder scan was done and had 28 mL urine. Patient is not retaining urine  Patient was rechecked at 03:25 AM. She states her pain had improved however starting to return. She was given IV Toradol. We discussed her CT results.  Recheck at 5 AM patient is sitting up in bed playing on her cell phone. She states her pain is almost gone. She feels ready to be discharged. She is advised to return if she gets a fever or has uncontrollable vomiting or pain. She was given discharge instructions including dietary guidelines to prevent kidney stones.  Labs Review Results for orders placed or performed during the hospital encounter of 02/07/16  Urinalysis, Routine w reflex microscopic  Result Value Ref Range   Color, Urine YELLOW YELLOW   APPearance CLEAR CLEAR   Specific Gravity, Urine 1.015 1.005 - 1.030   pH 6.5 5.0 - 8.0    Glucose, UA NEGATIVE NEGATIVE mg/dL   Hgb urine dipstick TRACE (A) NEGATIVE   Bilirubin Urine NEGATIVE NEGATIVE   Ketones, ur 15 (A) NEGATIVE mg/dL   Protein, ur NEGATIVE NEGATIVE mg/dL   Nitrite NEGATIVE NEGATIVE   Leukocytes, UA NEGATIVE NEGATIVE  Urine microscopic-add on  Result Value Ref Range   Squamous Epithelial / LPF 0-5 (A) NONE SEEN   WBC, UA 0-5 0 - 5 WBC/hpf   RBC / HPF 6-30 0 - 5 RBC/hpf   Bacteria, UA MANY (A) NONE SEEN   Urine-Other MUCOUS PRESENT     Laboratory interpretation all normal except hematuria   Imaging Review Ct Renal Stone Study  02/07/2016  CLINICAL DATA:  Severe left flank pain for 3 hours. History renal stones. EXAM: CT ABDOMEN AND PELVIS WITHOUT CONTRAST TECHNIQUE: Multidetector CT imaging  of the abdomen and pelvis was performed following the standard protocol without IV contrast. COMPARISON:  CT 08/08/2011 FINDINGS: Lower chest:  The included lung bases are clear. Liver: Suspect mild steatosis. No focal lesion allowing for lack contrast. Hepatobiliary: Gallbladder physiologically distended, no calcified stone. No biliary dilatation. Pancreas: No ductal dilatation or inflammation. Spleen: Normal. Adrenal glands: No nodule. Kidneys: 4 mm stone at the left ureterovesicular junction with moderate hydroureteronephrosis and mild perinephric stranding. 2 additional small stones in the upper and lower left kidney. Punctate stone in the interpolar right kidney, no right obstructive uropathy. Stomach/Bowel: Stomach physiologically distended. Small hiatal hernia. There are no dilated or thickened small bowel loops. Moderate volume of stool throughout the colon without colonic wall thickening. The appendix is normal. Vascular/Lymphatic: No retroperitoneal adenopathy. Abdominal aorta is normal in caliber. Reproductive: Uterus and adnexa normal for age. Bladder: Decompressed, no wall thickening. Other: No free air, free fluid, or intra-abdominal fluid collection.  Musculoskeletal: There are no acute or suspicious osseous abnormalities. IMPRESSION: Obstructing 4 mm stone at the left ureterovesicular junction with moderate hydroureteronephrosis. Additional bilateral nonobstructing renal calculi. Electronically Signed   By: Rubye Oaks M.D.   On: 02/07/2016 02:50   I have personally reviewed and evaluated these images and lab results as part of my medical decision-making.    MDM   Final diagnoses:  Left ureteral stone   New Prescriptions   NAPROXEN (NAPROSYN) 500 MG TABLET    Take 1 po BID with food prn pain   ONDANSETRON (ZOFRAN ODT) 4 MG DISINTEGRATING TABLET    Place 1 on the tongue every 8 hours as needed for nausea or vomiting   OXYCODONE-ACETAMINOPHEN (PERCOCET/ROXICET) 5-325 MG TABLET    Take 1 or 2 po Q 6hrs for pain   TAMSULOSIN (FLOMAX) 0.4 MG CAPS CAPSULE    Take 1 po QD until you pass the stone.    Plan discharge  Devoria Albe, MD, Concha Pyo, MD 02/07/16 [phone]

## 2016-02-07 NOTE — Discharge Instructions (Signed)
Drink plenty of fluids. Take the medications as prescribed. Return to the ED if you get fever or have uncontrolled vomiting or pain. [facility] if you feel like you have not passed a stone in the next week.    Dietary Guidelines to Help Prevent Kidney Stones Your risk of kidney stones can be decreased by adjusting the foods you eat. The most important thing you can do is drink enough fluid. You should drink enough fluid to keep your urine clear or pale yellow. The following guidelines provide specific information for the type of kidney stone you have had. GUIDELINES ACCORDING TO TYPE OF KIDNEY STONE Calcium Oxalate Kidney Stones  Reduce the amount of salt you eat. Foods that have a lot of salt cause your body to release excess calcium into your urine. The excess calcium can combine with a substance called oxalate to form kidney stones.  Reduce the amount of animal protein you eat if the amount you eat is excessive. Animal protein causes your body to release excess calcium into your urine. Ask your dietitian how much protein from animal sources you should be eating.  Avoid foods that are high in oxalates. If you take vitamins, they should have less than 500 mg of vitamin C. Your body turns vitamin C into oxalates. You do not need to avoid fruits and vegetables high in vitamin C. Calcium Phosphate Kidney Stones  Reduce the amount of salt you eat to help prevent the release of excess calcium into your urine.  Reduce the amount of animal protein you eat if the amount you eat is excessive. Animal protein causes your body to release excess calcium into your urine. Ask your dietitian how much protein from animal sources you should be eating.  Get enough calcium from food or take a calcium supplement (ask your dietitian for recommendations). Food sources of calcium that do not increase your risk of kidney stones include:  Broccoli.  Dairy products, such as cheese and  yogurt.  Pudding. Uric Acid Kidney Stones  Do not have more than 6 oz of animal protein per day. FOOD SOURCES Animal Protein Sources  Meat (all types).  Poultry.  Eggs.  Fish, seafood. Foods High in [employer] seasonings.  Soy sauce.  Teriyaki sauce.  Cured and processed meats.  Salted crackers and snack foods.  Fast food.  Canned soups and most canned foods. Foods High in Oxalates  Grains:  Amaranth.  Barley.  Grits.  Wheat germ.  Bran.  Buckwheat flour.  All bran cereals.  Pretzels.  Whole wheat bread.  Vegetables:  Beans (wax).  Beets and beet greens.  Collard greens.  Eggplant.  [place]ole.  Leeks.  Okra.  Parsley.  Rutabagas.  Spinach.  Swiss chard.  Tomato paste.  Fried potatoes.  Sweet potatoes.  Fruits:  Red currants.  Figs.  Kiwi.  Rhubarb.  Meat and Other Protein Sources:  Beans (dried).  Soy burgers and other soybean products.  Miso.  Nuts (peanuts, almonds, pecans, cashews, hazelnuts).  Nut butters.  Sesame seeds and tahini (paste made of sesame seeds).  Poppy seeds.  Beverages:  Chocolate drink mixes.  Soy milk.  Instant iced tea.  Juices made from high-oxalate fruits or vegetables.  Other:  Carob.  Chocolate.  Fruitcake.  Marmalades.   This information is not intended to replace advice given to you by your health care provider. Make sure you discuss any questions you have with your health care provider.   Document Released: 03/30/2011  Document Revised: 12/08/2013 Document Reviewed: 10/30/2013 Elsevier Interactive Patient Education [employer]

## 2016-02-07 NOTE — ED Notes (Signed)
Pt c/o severe left flank pain that started x 2 hours ago with vomiting and unable to urinate

## 2016-03-23 ENCOUNTER — Encounter: Payer: Self-pay | Admitting: Family Medicine

## 2016-03-23 ENCOUNTER — Ambulatory Visit (INDEPENDENT_AMBULATORY_CARE_PROVIDER_SITE_OTHER): Payer: BLUE CROSS/BLUE SHIELD | Admitting: Family Medicine

## 2016-03-23 ENCOUNTER — Encounter (INDEPENDENT_AMBULATORY_CARE_PROVIDER_SITE_OTHER): Payer: Self-pay

## 2016-03-23 VITALS — BP 160/98 | HR 81 | Temp 97.9°F | Ht 62.0 in | Wt 171.2 lb

## 2016-03-23 DIAGNOSIS — J320 Chronic maxillary sinusitis: Secondary | ICD-10-CM

## 2016-03-23 DIAGNOSIS — I10 Essential (primary) hypertension: Secondary | ICD-10-CM

## 2016-03-23 MED ORDER — LEVOFLOXACIN 500 MG PO TABS: 500.0000 mg | ORAL_TABLET | Freq: Every day | ORAL | Status: DC

## 2016-03-23 MED ORDER — BETAMETHASONE SOD PHOS & ACET 6 (3-3) MG/ML IJ SUSP
6.0000 mg | Freq: Once | INTRAMUSCULAR | Status: AC
  Administered 2016-03-23: 6 mg via INTRAMUSCULAR

## 2016-03-23 NOTE — Progress Notes (Signed)
Subjective:  Patient ID: Kayla Vazquez, female    DOB: 08-Apr-1966  Age: 50 y.o. MRN: [id number]  CC: Sinusitis   HPI CHARIE PINKUS presents for Patient presents with upper respiratory congestion. Rhinorrhea that is frequently purulent. There is moderate sore throat. Patient reports coughing minimally. Facial pain and left upper teeth hurt There is no fever no chills no sweats. The patient denies being short of breath. Onset was 7days ago. Gradually worsening   History Lyrica has a past medical history of Hypertension.   She has past surgical history that includes Breast reduction surgery; Nasal septum surgery; Endometrial ablation; and Lithotripsy.   Her family history includes Cancer in her mother.She reports that she has never smoked. She does not have any smokeless tobacco history on file. She reports that she drinks alcohol. She reports that she does not use illicit drugs.    ROS Review of Systems  Constitutional: Negative for fever, chills, activity change and appetite change.  HENT: Positive for congestion, postnasal drip, rhinorrhea and sinus pressure. Negative for ear discharge, ear pain, hearing loss, nosebleeds, sneezing and trouble swallowing.   Respiratory: Negative for chest tightness and shortness of breath.   Cardiovascular: Negative for chest pain and palpitations.  Skin: Negative for rash.    Objective:  BP 160/98 mmHg  Pulse 81  Temp(Src) 97.9 F (36.6 C) (Oral)  Ht  (1.575 m)  Wt 171 lb 3.2 oz (77.656 kg)  BMI 31.31 kg/m2  SpO2 96%  BP Readings from Last 3 Encounters:  03/23/16 160/98  02/07/16 164/91  06/21/15 131/99    Wt Readings from Last 3 Encounters:  03/23/16 171 lb 3.2 oz (77.656 kg)  02/07/16 162 lb (73.483 kg)  06/21/15 181 lb (82.101 kg)     Physical Exam  Constitutional: She appears well-developed and well-nourished.  HENT:  Head: Normocephalic and atraumatic.  Right Ear: Tympanic membrane and external ear normal. No  decreased hearing is noted.  Left Ear: Tympanic membrane and external ear normal. No decreased hearing is noted.  Nose: Mucosal edema present. Right sinus exhibits no frontal sinus tenderness. Left sinus exhibits no frontal sinus tenderness.  Mouth/Throat: No oropharyngeal exudate or posterior oropharyngeal erythema.  Neck: No Brudzinski's sign noted.  Pulmonary/Chest: Breath sounds normal. No respiratory distress.  Lymphadenopathy:       Head (right side): No preauricular adenopathy present.       Head (left side): No preauricular adenopathy present.       Right cervical: No superficial cervical adenopathy present.      Left cervical: No superficial cervical adenopathy present.     Lab Results  Component Value Date   HGB 13.7 08/16/2011   GLUCOSE 110* 06/21/2015   CHOL 219* 06/21/2015   TRIG 371* 06/21/2015   HDL 33* 06/21/2015   LDLCALC 112* 06/21/2015   ALT 47* 06/21/2015   AST 34 06/21/2015   NA 139 06/21/2015   K 4.7 06/21/2015   CL 100 06/21/2015   CREATININE 0.90 06/21/2015   BUN 10 06/21/2015   CO2 24 06/21/2015    Ct Renal Stone Study  02/07/2016  CLINICAL DATA:  Severe left flank pain for 3 hours. History renal stones. EXAM: CT ABDOMEN AND PELVIS WITHOUT CONTRAST TECHNIQUE: Multidetector CT imaging of the abdomen and pelvis was performed following the standard protocol without IV contrast. COMPARISON:  CT 08/08/2011 FINDINGS: Lower chest:  The included lung bases are clear. Liver: Suspect mild steatosis. No focal lesion allowing for lack contrast. Hepatobiliary: Gallbladder  physiologically distended, no calcified stone. No biliary dilatation. Pancreas: No ductal dilatation or inflammation. Spleen: Normal. Adrenal glands: No nodule. Kidneys: 4 mm stone at the left ureterovesicular junction with moderate hydroureteronephrosis and mild perinephric stranding. 2 additional small stones in the upper and lower left kidney. Punctate stone in the interpolar right kidney, no right  obstructive uropathy. Stomach/Bowel: Stomach physiologically distended. Small hiatal hernia. There are no dilated or thickened small bowel loops. Moderate volume of stool throughout the colon without colonic wall thickening. The appendix is normal. Vascular/Lymphatic: No retroperitoneal adenopathy. Abdominal aorta is normal in caliber. Reproductive: Uterus and adnexa normal for age. Bladder: Decompressed, no wall thickening. Other: No free air, free fluid, or intra-abdominal fluid collection. Musculoskeletal: There are no acute or suspicious osseous abnormalities. IMPRESSION: Obstructing 4 mm stone at the left ureterovesicular junction with moderate hydroureteronephrosis. Additional bilateral nonobstructing renal calculi. Electronically Signed   By: Rubye OaksMelanie  Ehinger M.D.   On: 02/07/2016 02:50    Assessment & Plan:   Lynden AngCathy was seen today for sinusitis.  Diagnoses and all orders for this visit:  Left maxillary sinusitis -     betamethasone acetate-betamethasone sodium phosphate (CELESTONE) injection 6 mg; Inject 1 mL (6 mg total) into the muscle once.  Essential hypertension  Other orders -     levofloxacin (LEVAQUIN) 500 MG tablet; Take 1 tablet (500 mg total) by mouth daily. For 10 days   DASH Plan printed with AVS   I have discontinued Ms. Schroyer's tamsulosin, ondansetron, oxyCODONE-acetaminophen, and naproxen. I am also having her start on levofloxacin. Additionally, I am having her maintain her lisinopril, venlafaxine XR, and estrogens conjugated (synthetic A). We will continue to administer betamethasone acetate-betamethasone sodium phosphate.  Meds ordered this encounter  Medications  . levofloxacin (LEVAQUIN) 500 MG tablet    Sig: Take 1 tablet (500 mg total) by mouth daily. For 10 days    Dispense:  10 tablet    Refill:  0  . betamethasone acetate-betamethasone sodium phosphate (CELESTONE) injection 6 mg    Sig:      Follow-up: Return in about 3 weeks (around 04/13/2016), or  if symptoms worsen or fail to improve, for hypertension.  Mechele ClaudeWarren Cadan Maggart, M.D.

## 2016-03-23 NOTE — Patient Instructions (Signed)
DASH Eating Plan  DASH stands for "Dietary Approaches to Stop Hypertension." The DASH eating plan is a healthy eating plan that has been shown to reduce high blood pressure (hypertension). Additional health benefits may include reducing the risk of type 2 diabetes mellitus, heart disease, and stroke. The DASH eating plan may also help with weight loss.  WHAT DO I NEED TO KNOW ABOUT THE DASH EATING PLAN?  For the DASH eating plan, you will follow these general guidelines:  · Choose foods with a percent daily value for sodium of less than 5% (as listed on the food label).  · Use salt-free seasonings or herbs instead of table salt or sea salt.  · Check with your health care provider or pharmacist before using salt substitutes.  · Eat lower-sodium products, often labeled as "lower sodium" or "no salt added."  · Eat fresh foods.  · Eat more vegetables, fruits, and low-fat dairy products.  · Choose whole grains. Look for the word "whole" as the first word in the ingredient list.  · Choose fish and skinless chicken or turkey more often than red meat. Limit fish, poultry, and meat to 6 oz (170 g) each day.  · Limit sweets, desserts, sugars, and sugary drinks.  · Choose heart-healthy fats.  · Limit cheese to 1 oz (28 g) per day.  · Eat more home-cooked food and less restaurant, buffet, and fast food.  · Limit fried foods.  · Cook foods using methods other than frying.  · Limit canned vegetables. If you do use them, rinse them well to decrease the sodium.  · When eating at a restaurant, ask that your food be prepared with less salt, or no salt if possible.  WHAT FOODS CAN I EAT?  Seek help from a dietitian for individual calorie needs.  Grains  Whole grain or whole wheat bread. Brown rice. Whole grain or whole wheat pasta. Quinoa, bulgur, and whole grain cereals. Low-sodium cereals. Corn or whole wheat flour tortillas. Whole grain cornbread. Whole grain crackers. Low-sodium crackers.  Vegetables  Fresh or frozen vegetables  (raw, steamed, roasted, or grilled). Low-sodium or reduced-sodium tomato and vegetable juices. Low-sodium or reduced-sodium tomato sauce and paste. Low-sodium or reduced-sodium canned vegetables.   Fruits  All fresh, canned (in natural juice), or frozen fruits.  Meat and Other Protein Products  Ground beef (85% or leaner), grass-fed beef, or beef trimmed of fat. Skinless chicken or turkey. Ground chicken or turkey. Pork trimmed of fat. All fish and seafood. Eggs. Dried beans, peas, or lentils. Unsalted nuts and seeds. Unsalted canned beans.  Dairy  Low-fat dairy products, such as skim or 1% milk, 2% or reduced-fat cheeses, low-fat ricotta or cottage cheese, or plain low-fat yogurt. Low-sodium or reduced-sodium cheeses.  Fats and Oils  Tub margarines without trans fats. Light or reduced-fat mayonnaise and salad dressings (reduced sodium). Avocado. Safflower, olive, or canola oils. Natural peanut or almond butter.  Other  Unsalted popcorn and pretzels.  The items listed above may not be a complete list of recommended foods or beverages. Contact your dietitian for more options.  WHAT FOODS ARE NOT RECOMMENDED?  Grains  White bread. White pasta. White rice. Refined cornbread. Bagels and croissants. Crackers that contain trans fat.  Vegetables  Creamed or fried vegetables. Vegetables in a cheese sauce. Regular canned vegetables. Regular canned tomato sauce and paste. Regular tomato and vegetable juices.  Fruits  Dried fruits. Canned fruit in light or heavy syrup. Fruit juice.  Meat and Other Protein   Products  Fatty cuts of meat. Ribs, chicken wings, bacon, sausage, bologna, salami, chitterlings, fatback, hot dogs, bratwurst, and packaged luncheon meats. Salted nuts and seeds. Canned beans with salt.  Dairy  Whole or 2% milk, cream, half-and-half, and cream cheese. Whole-fat or sweetened yogurt. Full-fat cheeses or blue cheese. Nondairy creamers and whipped toppings. Processed cheese, cheese spreads, or cheese  curds.  Condiments  Onion and garlic salt, seasoned salt, table salt, and sea salt. Canned and packaged gravies. Worcestershire sauce. Tartar sauce. Barbecue sauce. Teriyaki sauce. Soy sauce, including reduced sodium. Steak sauce. Fish sauce. Oyster sauce. Cocktail sauce. Horseradish. Ketchup and mustard. Meat flavorings and tenderizers. Bouillon cubes. Hot sauce. Tabasco sauce. Marinades. Taco seasonings. Relishes.  Fats and Oils  Butter, stick margarine, lard, shortening, ghee, and bacon fat. Coconut, palm kernel, or palm oils. Regular salad dressings.  Other  Pickles and olives. Salted popcorn and pretzels.  The items listed above may not be a complete list of foods and beverages to avoid. Contact your dietitian for more information.  WHERE CAN I FIND MORE INFORMATION?  National Heart, [employer], and [employer] Institute: www.nhlbi.nih.gov/health/health-topics/topics/dash/     This information is not intended to replace advice given to you by your health care provider. Make sure you discuss any questions you have with your health care provider.     Document Released: 11/22/2011 Document Revised: 12/24/2014 Document Reviewed: 10/07/2013  Elsevier Interactive Patient Education ©2016 Elsevier Inc.

## 2016-04-05 ENCOUNTER — Encounter: Payer: Self-pay | Admitting: Nurse Practitioner

## 2016-04-05 ENCOUNTER — Ambulatory Visit (INDEPENDENT_AMBULATORY_CARE_PROVIDER_SITE_OTHER): Payer: BLUE CROSS/BLUE SHIELD | Admitting: Nurse Practitioner

## 2016-04-05 VITALS — BP 140/88 | HR 88 | Temp 97.7°F | Ht 62.0 in | Wt 171.2 lb

## 2016-04-05 DIAGNOSIS — Z1212 Encounter for screening for malignant neoplasm of rectum: Secondary | ICD-10-CM

## 2016-04-05 DIAGNOSIS — F411 Generalized anxiety disorder: Secondary | ICD-10-CM | POA: Diagnosis not present

## 2016-04-05 DIAGNOSIS — I10 Essential (primary) hypertension: Secondary | ICD-10-CM

## 2016-04-05 DIAGNOSIS — F32A Depression, unspecified: Secondary | ICD-10-CM

## 2016-04-05 DIAGNOSIS — F329 Major depressive disorder, single episode, unspecified: Secondary | ICD-10-CM | POA: Diagnosis not present

## 2016-04-05 DIAGNOSIS — N951 Menopausal and female climacteric states: Secondary | ICD-10-CM

## 2016-04-05 LAB — CMP14+EGFR
ALK PHOS: 63 IU/L (ref 39–117)
ALT: 19 IU/L (ref 0–32)
AST: 19 IU/L (ref 0–40)
Albumin/Globulin Ratio: 2.2 (ref 1.2–2.2)
Albumin: 4.4 g/dL (ref 3.5–5.5)
BILIRUBIN TOTAL: 0.6 mg/dL (ref 0.0–1.2)
BUN/Creatinine Ratio: 19 (ref 9–23)
BUN: 16 mg/dL (ref 6–24)
CHLORIDE: 102 mmol/L (ref 96–106)
CO2: 23 mmol/L (ref 18–29)
Calcium: 9.9 mg/dL (ref 8.7–10.2)
Creatinine, Ser: 0.86 mg/dL (ref 0.57–1.00)
GFR calc Af Amer: 90 mL/min/{1.73_m2} (ref >59)
GFR calc non Af Amer: 78 mL/min/{1.73_m2} (ref >59)
GLUCOSE: 87 mg/dL (ref 65–99)
Globulin, Total: 2 g/dL (ref 1.5–4.5)
Potassium: 4.2 mmol/L (ref 3.5–5.2)
Sodium: 143 mmol/L (ref 134–144)
Total Protein: 6.4 g/dL (ref 6.0–8.5)

## 2016-04-05 LAB — LIPID PANEL
CHOLESTEROL TOTAL: 208 mg/dL — AB (ref 100–199)
Chol/HDL Ratio: 5 ratio units — ABNORMAL HIGH (ref 0.0–4.4)
HDL: 42 mg/dL (ref >39)
LDL Calculated: 104 mg/dL — ABNORMAL HIGH (ref 0–99)
TRIGLYCERIDES: 308 mg/dL — AB (ref 0–149)
VLDL CHOLESTEROL CAL: 62 mg/dL — AB (ref 5–40)

## 2016-04-05 MED ORDER — LISINOPRIL 20 MG PO TABS: 20.0000 mg | ORAL_TABLET | Freq: Every day | ORAL | Status: DC

## 2016-04-05 MED ORDER — ESTROGENS CONJ SYNTHETIC A 0.3 MG PO TABS: 0.3000 mg | ORAL_TABLET | Freq: Every day | ORAL | Status: DC

## 2016-04-05 MED ORDER — VENLAFAXINE HCL ER 150 MG PO CP24: 300.0000 mg | ORAL_CAPSULE | Freq: Every day | ORAL | Status: DC

## 2016-04-05 MED ORDER — VENLAFAXINE HCL ER 150 MG PO CP24: 150.0000 mg | ORAL_CAPSULE | Freq: Every day | ORAL | Status: DC

## 2016-04-05 NOTE — Progress Notes (Signed)
Subjective:    Patient ID: Kayla Vazquez, female    DOB: 06/09/1966, 50 y.o.   MRN: [id number]  Patient here today for follow up of chronic medical problems.  Outpatient Encounter Prescriptions as of 04/05/2016  Medication Sig  . estrogens conjugated, synthetic A, (CENESTIN) 0.3 MG tablet Take 0.3 mg by mouth daily.  Marland Kitchen lisinopril (PRINIVIL,ZESTRIL) 20 MG tablet Take 1 tablet (20 mg total) by mouth daily.  Marland Kitchen venlafaxine XR (EFFEXOR-XR) 150 MG 24 hr capsule TAKE 1 CAPSULE DAILY     Hypertension This is a chronic problem. The current episode started more than 1 year ago. The problem is controlled. Pertinent negatives include no anxiety, chest pain, headaches, palpitations or peripheral edema. There are no associated agents to hypertension. Risk factors for coronary artery disease include stress and post-menopausal state. Past treatments include ACE inhibitors and diuretics. The current treatment provides moderate improvement. Compliance problems include diet and exercise.  There is no history of CAD/MI or CVA.  GAD/depression She is currently on effexor daily- no c/o side effects- helps her to stay calm and keeps her from worrying so much.       Review of Systems  Constitutional: Negative.   HENT: Negative.   Respiratory: Negative.   Cardiovascular: Negative for chest pain and palpitations.  Genitourinary: Negative.   Neurological: Negative for headaches.  Psychiatric/Behavioral: Negative.   All other systems reviewed and are negative.      Objective:   Physical Exam  Constitutional: She is oriented to person, place, and time. She appears well-developed and well-nourished.  HENT:  Nose: Nose normal.  Mouth/Throat: Oropharynx is clear and moist.  Eyes: EOM are normal.  Neck: Trachea normal, normal range of motion and full passive range of motion without pain. Neck supple. No JVD present. Carotid bruit is not present. No thyromegaly present.  Cardiovascular: Normal rate,  regular rhythm, normal heart sounds and intact distal pulses.  Exam reveals no gallop and no friction rub.   No murmur heard. Pulmonary/Chest: Effort normal and breath sounds normal.  Abdominal: Soft. Bowel sounds are normal. She exhibits no distension and no mass. There is no tenderness.  Musculoskeletal: Normal range of motion.  Lymphadenopathy:    She has no cervical adenopathy.  Neurological: She is alert and oriented to person, place, and time. She has normal reflexes.  Skin: Skin is warm and dry.  Psychiatric: She has a normal mood and affect. Her behavior is normal. Judgment and thought content normal.   BP 140/88 mmHg  Pulse 88  Temp(Src) 97.7 F (36.5 C) (Oral)  Ht '5\' 2"'  (1.575 m)  Wt 171 lb 3.2 oz (77.656 kg)  BMI 31.31 kg/m2      Assessment & Plan:  1. Essential hypertension Do not add salt o diet - lisinopril (PRINIVIL,ZESTRIL) 20 MG tablet; Take 1 tablet (20 mg total) by mouth daily.  Dispense: 90 tablet; Refill: 1 - CMP14+EGFR - Lipid panel  2. GAD (generalized anxiety disorder) Stress management Increased effexor from 166m to 307m- venlafaxine XR (EFFEXOR-XR) 150 MG 24 hr capsule; Take 2 capsules (300 mg total) by mouth daily.  Dispense: 180 capsule; Refill: 1  3. Depression Stress management  4. Perimenopausal vasomotor symptoms - estrogens conjugated, synthetic A, (CENESTIN) 0.3 MG tablet; Take 1 tablet (0.3 mg total) by mouth daily.  Dispense: 90 tablet; Refill: 1   hemoccult cards given to patient with directions Labs pending Health maintenance reviewed Diet and exercise encouraged Continue all meds Follow up  In  6 month   Mary-Margaret Hassell Done, FNP

## 2016-04-05 NOTE — Patient Instructions (Signed)
Stress and Stress Management Stress is a normal reaction to life events. It is what you feel when life demands more than you are used to or more than you can handle. Some stress can be useful. For example, the stress reaction can help you catch the last bus of the day, study for a test, or meet a deadline at work. But stress that occurs too often or for too long can cause problems. It can affect your emotional health and interfere with relationships and normal daily activities. Too much stress can weaken your immune system and increase your risk for physical illness. If you already have a medical problem, stress can make it worse. CAUSES  All sorts of life events may cause stress. An event that causes stress for one person may not be stressful for another person. Major life events commonly cause stress. These may be positive or negative. Examples include losing your job, moving into a new home, getting married, having a baby, or losing a loved one. Less obvious life events may also cause stress, especially if they occur day after day or in combination. Examples include working long hours, driving in traffic, caring for children, being in debt, or being in a difficult relationship. SIGNS AND SYMPTOMS Stress may cause emotional symptoms including, the following:  Anxiety. This is feeling worried, afraid, on edge, overwhelmed, or out of control.  Anger. This is feeling irritated or impatient.  Depression. This is feeling sad, down, helpless, or guilty.  Difficulty focusing, remembering, or making decisions. Stress may cause physical symptoms, including the following:   Aches and pains. These may affect your head, neck, back, stomach, or other areas of your body.  Tight muscles or clenched jaw.  Low energy or trouble sleeping. Stress may cause unhealthy behaviors, including the following:   Eating to feel better (overeating) or skipping meals.  Sleeping too little, too much, or both.  Working  too much or putting off tasks (procrastination).  Smoking, drinking alcohol, or using drugs to feel better. DIAGNOSIS  Stress is diagnosed through an assessment by your health care provider. Your health care provider will ask questions about your symptoms and any stressful life events.Your health care provider will also ask about your medical history and may order blood tests or other tests. Certain medical conditions and medicine can cause physical symptoms similar to stress. Mental illness can cause emotional symptoms and unhealthy behaviors similar to stress. Your health care provider may refer you to a mental health professional for further evaluation.  TREATMENT  Stress management is the recommended treatment for stress.The goals of stress management are reducing stressful life events and coping with stress in healthy ways.  Techniques for reducing stressful life events include the following:  Stress identification. Self-monitor for stress and identify what causes stress for you. These skills may help you to avoid some stressful events.  Time management. Set your priorities, keep a calendar of events, and learn to say "no." These tools can help you avoid making too many commitments. Techniques for coping with stress include the following:  Rethinking the problem. Try to think realistically about stressful events rather than ignoring them or overreacting. Try to find the positives in a stressful situation rather than focusing on the negatives.  Exercise. Physical exercise can release both physical and emotional tension. The key is to find a form of exercise you enjoy and do it regularly.  Relaxation techniques. These relax the body and mind. Examples include yoga, meditation, tai chi, biofeedback, deep  breathing, progressive muscle relaxation, listening to music, being out in nature, journaling, and other hobbies. Again, the key is to find one or more that you enjoy and can do  regularly.  Healthy lifestyle. Eat a balanced diet, get plenty of sleep, and do not smoke. Avoid using alcohol or drugs to relax.  Strong support network. Spend time with family, friends, or other people you enjoy being around.Express your feelings and talk things over with someone you trust. Counseling or talktherapy with a mental health professional may be helpful if you are having difficulty managing stress on your own. Medicine is typically not recommended for the treatment of stress.Talk to your health care provider if you think you need medicine for symptoms of stress. HOME CARE INSTRUCTIONS  Keep all follow-up visits as directed by your health care provider.  Take all medicines as directed by your health care provider. SEEK MEDICAL CARE IF:  Your symptoms get worse or you start having new symptoms.  You feel overwhelmed by your problems and can no longer manage them on your own. SEEK IMMEDIATE MEDICAL CARE IF:  You feel like hurting yourself or someone else.   This information is not intended to replace advice given to you by your health care provider. Make sure you discuss any questions you have with your health care provider.   Document Released: 05/29/2001 Document Revised: 12/24/2014 Document Reviewed: 07/28/2013 Elsevier Interactive Patient Education 2016 Elsevier Inc.  

## 2016-04-06 ENCOUNTER — Telehealth: Payer: Self-pay | Admitting: Nurse Practitioner

## 2016-04-06 DIAGNOSIS — N951 Menopausal and female climacteric states: Secondary | ICD-10-CM

## 2016-04-07 MED ORDER — ESTRADIOL 0.5 MG PO TABS: 0.5000 mg | ORAL_TABLET | Freq: Every day | ORAL | Status: DC

## 2016-04-07 NOTE — Telephone Encounter (Signed)
Patient aware of medication change. 

## 2016-04-07 NOTE — Telephone Encounter (Signed)
Cenestin discontinued. Estrace 0.5 mg Prescription sent to pharmacy

## 2016-04-09 ENCOUNTER — Telehealth: Payer: Self-pay | Admitting: Nurse Practitioner

## 2016-04-09 NOTE — Telephone Encounter (Signed)
Spoke to [facility] and advised a different rx for estradiol had been sent in on 04/07/16. They verified they had received it and will get it ready for the pt. No other assistance was needed.

## 2016-04-17 ENCOUNTER — Other Ambulatory Visit: Payer: Self-pay | Admitting: Nurse Practitioner

## 2016-05-28 ENCOUNTER — Ambulatory Visit (INDEPENDENT_AMBULATORY_CARE_PROVIDER_SITE_OTHER): Payer: BLUE CROSS/BLUE SHIELD | Admitting: Family

## 2016-05-28 ENCOUNTER — Encounter: Payer: Self-pay | Admitting: Family

## 2016-05-28 VITALS — BP 123/90 | HR 112 | Temp 97.9°F | Ht 62.0 in | Wt 168.2 lb

## 2016-05-28 DIAGNOSIS — J069 Acute upper respiratory infection, unspecified: Secondary | ICD-10-CM | POA: Diagnosis not present

## 2016-05-28 MED ORDER — PREDNISONE 10 MG (21) PO TBPK: 10.0000 mg | ORAL_TABLET | Freq: Every day | ORAL | Status: DC

## 2016-05-28 MED ORDER — AZITHROMYCIN 250 MG PO TABS: ORAL_TABLET | ORAL | Status: DC

## 2016-05-28 MED ORDER — FLUTICASONE PROPIONATE 50 MCG/ACT NA SUSP: 2.0000 | Freq: Every day | NASAL | Status: DC

## 2016-05-28 NOTE — Addendum Note (Signed)
Addended by: Jannifer RodneyHAWKS, Nalleli Largent A on: 05/28/2016 05:17 PM   Modules accepted: Orders

## 2016-05-28 NOTE — Patient Instructions (Signed)
Upper Respiratory Infection, Adult Most upper respiratory infections (URIs) are a viral infection of the air passages leading to the lungs. A URI affects the nose, throat, and upper air passages. The most common type of URI is nasopharyngitis and is typically referred to as "the common cold." URIs run their course and usually go away on their own. Most of the time, a URI does not require medical attention, but sometimes a bacterial infection in the upper airways can follow a viral infection. This is called a secondary infection. Sinus and middle ear infections are common types of secondary upper respiratory infections. Bacterial pneumonia can also complicate a URI. A URI can worsen asthma and chronic obstructive pulmonary disease (COPD). Sometimes, these complications can require emergency medical care and may be life threatening.  CAUSES Almost all URIs are caused by viruses. A virus is a type of germ and can spread from one person to another.  RISKS FACTORS You may be at risk for a URI if:   You smoke.   You have chronic heart or lung disease.  You have a weakened defense (immune) system.   You are very young or very old.   You have nasal allergies or asthma.  You work in crowded or poorly ventilated areas.  You work in health care facilities or schools. SIGNS AND SYMPTOMS  Symptoms typically develop 2-3 days after you come in contact with a cold virus. Most viral URIs last 7-10 days. However, viral URIs from the influenza virus (flu virus) can last 14-18 days and are typically more severe. Symptoms may include:   Runny or stuffy (congested) nose.   Sneezing.   Cough.   Sore throat.   Headache.   Fatigue.   Fever.   Loss of appetite.   Pain in your forehead, behind your eyes, and over your cheekbones (sinus pain).  Muscle aches.  DIAGNOSIS  Your health care provider may diagnose a URI by:  Physical exam.  Tests to check that your symptoms are not due to  another condition such as:  Strep throat.  Sinusitis.  Pneumonia.  Asthma. TREATMENT  A URI goes away on its own with time. It cannot be cured with medicines, but medicines may be prescribed or recommended to relieve symptoms. Medicines may help:  Reduce your fever.  Reduce your cough.  Relieve nasal congestion. HOME CARE INSTRUCTIONS   Take medicines only as directed by your health care provider.   Gargle warm saltwater or take cough drops to comfort your throat as directed by your health care provider.  Use a warm mist humidifier or inhale steam from a shower to increase air moisture. This may make it easier to breathe.  Drink enough fluid to keep your urine clear or pale yellow.   Eat soups and other clear broths and maintain good nutrition.   Rest as needed.   Return to work when your temperature has returned to normal or as your health care provider advises. You may need to stay home longer to avoid infecting others. You can also use a face mask and careful hand washing to prevent spread of the virus.  Increase the usage of your inhaler if you have asthma.   Do not use any tobacco products, including cigarettes, chewing tobacco, or electronic cigarettes. If you need help quitting, ask your health care provider. PREVENTION  The best way to protect yourself from getting a cold is to practice good hygiene.   Avoid oral or hand contact with people with cold   symptoms.   Wash your hands often if contact occurs.  There is no clear evidence that vitamin C, vitamin E, echinacea, or exercise reduces the chance of developing a cold. However, it is always recommended to get plenty of rest, exercise, and practice good nutrition.  SEEK MEDICAL CARE IF:   You are getting worse rather than better.   Your symptoms are not controlled by medicine.   You have chills.  You have worsening shortness of breath.  You have brown or red mucus.  You have yellow or brown nasal  discharge.  You have pain in your face, especially when you bend forward.  You have a fever.  You have swollen neck glands.  You have pain while swallowing.  You have white areas in the back of your throat. SEEK IMMEDIATE MEDICAL CARE IF:   You have severe or persistent:  Headache.  Ear pain.  Sinus pain.  Chest pain.  You have chronic lung disease and any of the following:  Wheezing.  Prolonged cough.  Coughing up blood.  A change in your usual mucus.  You have a stiff neck.  You have changes in your:  Vision.  Hearing.  Thinking.  Mood. MAKE SURE YOU:   Understand these instructions.  Will watch your condition.  Will get help right away if you are not doing well or get worse.   This information is not intended to replace advice given to you by your health care provider. Make sure you discuss any questions you have with your health care provider.   Document Released: 05/29/2001 Document Revised: 04/19/2015 Document Reviewed: 03/10/2014 Elsevier Interactive Patient Education 2016 [employer]  - Take meds as prescribed - Use a cool mist humidifier  -Use saline nose sprays frequently -Saline irrigations of the nose can be very helpful if done frequently.  * 4X daily for 1 week*  * Use of a nettie pot can be helpful with this. Follow directions with this* -Force fluids -For any cough or congestion  Use plain Mucinex- regular strength or max strength is fine   * Children- consult with Pharmacist for dosing -For fever or aces or pains- take tylenol or ibuprofen appropriate for age and weight.  * for fevers greater than 101 orally you may alternate ibuprofen and tylenol every  3 hours. -Throat lozenges if help   Dexton Zwilling, FNP   

## 2016-05-28 NOTE — Progress Notes (Signed)
   Subjective:    Patient ID: Kayla Vazquez, female    DOB: [birth year], 50 y.o.   MRN: [id number]  Sinus Problem This is a new problem. The current episode started in the past 7 days. The problem has been waxing and waning since onset. There has been no fever. Her pain is at a severity of 2/10. The pain is mild. Associated symptoms include chills, congestion, coughing, ear pain, headaches, a hoarse voice, neck pain, sinus pressure, sneezing and a sore throat. Pertinent negatives include no diaphoresis. Past treatments include oral decongestants. The treatment provided mild relief.      Review of Systems  Constitutional: Positive for chills. Negative for diaphoresis.  HENT: Positive for congestion, ear pain, hoarse voice, sinus pressure, sneezing and sore throat.   Respiratory: Positive for cough.   Musculoskeletal: Positive for neck pain.  Neurological: Positive for headaches.  All other systems reviewed and are negative.      Objective:   Physical Exam  Constitutional: She is oriented to person, place, and time. She appears well-developed and well-nourished. No distress.  HENT:  Head: Normocephalic and atraumatic.  Right Ear: External ear normal. Tympanic membrane is bulging.  Left Ear: External ear normal. Tympanic membrane is erythematous and bulging.  Nose: Right sinus exhibits maxillary sinus tenderness. Right sinus exhibits no frontal sinus tenderness. Left sinus exhibits maxillary sinus tenderness. Left sinus exhibits no frontal sinus tenderness.  Mouth/Throat: Oropharynx is clear and moist.  Nasal passage erythemas with mild swelling    Eyes: Pupils are equal, round, and reactive to light.  Neck: Normal range of motion. Neck supple. No thyromegaly present.  Cardiovascular: Normal rate, regular rhythm, normal heart sounds and intact distal pulses.   No murmur heard. Pulmonary/Chest: Effort normal and breath sounds normal. No respiratory distress. She has no wheezes.    Abdominal: Soft. Bowel sounds are normal. She exhibits no distension. There is no tenderness.  Musculoskeletal: Normal range of motion. She exhibits no edema or tenderness.  Neurological: She is alert and oriented to person, place, and time. She has normal reflexes. No cranial nerve deficit.  Skin: Skin is warm and dry.  Psychiatric: She has a normal mood and affect. Her behavior is normal. Judgment and thought content normal.  Vitals reviewed.     BP 123/90 mmHg  Pulse 112  Temp(Src) 97.9 F (36.6 C) (Oral)  Ht 5\' 2"  (1.575 m)  Wt 168 lb 3.2 oz (76.295 kg)  BMI 30.76 kg/m2     Assessment & Plan:  1. Acute upper respiratory infection -- Take meds as prescribed - Use a cool mist humidifier  -Use saline nose sprays frequently -Saline irrigations of the nose can be very helpful if done frequently.  * 4X daily for 1 week*  * Use of a nettie pot can be helpful with this. Follow directions with this* -Force fluids -For any cough or congestion  Use plain Mucinex- regular strength or max strength is fine   * Children- consult with Pharmacist for dosing -For fever or aces or pains- take tylenol or ibuprofen appropriate for age and weight.  * for fevers greater than 101 orally you may alternate ibuprofen and tylenol every  3 hours. -Throat lozenges if help - azithromycin (ZITHROMAX Z-PAK) 250 MG tablet; As directed  Dispense: 1 each; Refill: 0 - fluticasone (FLONASE) 50 MCG/ACT nasal spray; Place 2 sprays into both nostrils daily.  Dispense: 16 g; Refill: 6  Jannifer Rodneyhristy Montrae Braithwaite, FNP

## 2016-08-24 ENCOUNTER — Other Ambulatory Visit: Payer: Self-pay | Admitting: Nurse Practitioner

## 2016-08-24 ENCOUNTER — Other Ambulatory Visit: Payer: Self-pay | Admitting: Family

## 2016-08-24 DIAGNOSIS — F411 Generalized anxiety disorder: Secondary | ICD-10-CM

## 2016-08-24 NOTE — Telephone Encounter (Signed)
Med refilled note about not being seen since January is on wrong patient s chart

## 2016-08-24 NOTE — Telephone Encounter (Signed)
Patient has not been een since January and that was for abscess tooth

## 2016-09-04 ENCOUNTER — Emergency Department (HOSPITAL_COMMUNITY): Payer: No Typology Code available for payment source

## 2016-09-04 ENCOUNTER — Encounter (HOSPITAL_COMMUNITY): Payer: Self-pay | Admitting: *Deleted

## 2016-09-04 ENCOUNTER — Emergency Department (HOSPITAL_COMMUNITY)
Admission: EM | Admit: 2016-09-04 | Discharge: 2016-09-04 | Disposition: A | Discharge status: Home / Self Care | Payer: No Typology Code available for payment source | Attending: Emergency Medicine | Admitting: Emergency Medicine

## 2016-09-04 DIAGNOSIS — R51 Headache: Secondary | ICD-10-CM | POA: Diagnosis not present

## 2016-09-04 DIAGNOSIS — S40212A Abrasion of left shoulder, initial encounter: Secondary | ICD-10-CM | POA: Insufficient documentation

## 2016-09-04 DIAGNOSIS — Y999 Unspecified external cause status: Secondary | ICD-10-CM | POA: Insufficient documentation

## 2016-09-04 DIAGNOSIS — I10 Essential (primary) hypertension: Secondary | ICD-10-CM | POA: Diagnosis not present

## 2016-09-04 DIAGNOSIS — Y92410 Unspecified street and highway as the place of occurrence of the external cause: Secondary | ICD-10-CM | POA: Insufficient documentation

## 2016-09-04 DIAGNOSIS — S301XXA Contusion of abdominal wall, initial encounter: Secondary | ICD-10-CM | POA: Insufficient documentation

## 2016-09-04 DIAGNOSIS — Z79899 Other long term (current) drug therapy: Secondary | ICD-10-CM | POA: Insufficient documentation

## 2016-09-04 DIAGNOSIS — S1093XA Contusion of unspecified part of neck, initial encounter: Secondary | ICD-10-CM | POA: Insufficient documentation

## 2016-09-04 DIAGNOSIS — S60512A Abrasion of left hand, initial encounter: Secondary | ICD-10-CM | POA: Insufficient documentation

## 2016-09-04 DIAGNOSIS — Y9389 Activity, other specified: Secondary | ICD-10-CM | POA: Insufficient documentation

## 2016-09-04 DIAGNOSIS — S7012XA Contusion of left thigh, initial encounter: Secondary | ICD-10-CM | POA: Insufficient documentation

## 2016-09-04 DIAGNOSIS — S0993XA Unspecified injury of face, initial encounter: Secondary | ICD-10-CM | POA: Diagnosis present

## 2016-09-04 DIAGNOSIS — S0003XA Contusion of scalp, initial encounter: Secondary | ICD-10-CM

## 2016-09-04 DIAGNOSIS — S0083XA Contusion of other part of head, initial encounter: Secondary | ICD-10-CM | POA: Insufficient documentation

## 2016-09-04 MED ORDER — MORPHINE SULFATE (PF) 2 MG/ML IV SOLN
2.0000 mg | Freq: Once | INTRAVENOUS | Status: AC
  Administered 2016-09-04: 2 mg via INTRAVENOUS
  Filled 2016-09-04: qty 1

## 2016-09-04 MED ORDER — IOPAMIDOL (ISOVUE-300) INJECTION 61%
100.0000 mL | Freq: Once | INTRAVENOUS | Status: AC | PRN
  Administered 2016-09-04: 100 mL via INTRAVENOUS

## 2016-09-04 MED ORDER — CHLORZOXAZONE 500 MG PO TABS: 500.0000 mg | ORAL_TABLET | Freq: Three times a day (TID) | ORAL | 0 refills | Status: DC

## 2016-09-04 MED ORDER — HYDROCODONE-ACETAMINOPHEN 5-325 MG PO TABS: 1.0000 | ORAL_TABLET | ORAL | 0 refills | Status: DC | PRN

## 2016-09-04 MED ORDER — ONDANSETRON HCL 4 MG/2ML IJ SOLN
4.0000 mg | Freq: Once | INTRAMUSCULAR | Status: AC
  Administered 2016-09-04: 4 mg via INTRAVENOUS
  Filled 2016-09-04: qty 2

## 2016-09-04 MED ORDER — DICLOFENAC SODIUM 75 MG PO TBEC: 75.0000 mg | DELAYED_RELEASE_TABLET | Freq: Two times a day (BID) | ORAL | 0 refills | Status: DC
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
## 2016-09-04 NOTE — Discharge Instructions (Signed)
Your x-ray of the shoulder is negative for fracture or dislocation. The CT scans of your abdomen, neck, and head are all negative for acute findings. Please use Parafon forte, diclofenac, and Norco for pain. Parafon forte and Norco may cause drowsiness, please use these medications with caution. Please return if any emergent changes, problems, or concerns.

## 2016-11-06 ENCOUNTER — Other Ambulatory Visit: Payer: Self-pay | Admitting: Nurse Practitioner

## 2016-11-06 DIAGNOSIS — I10 Essential (primary) hypertension: Secondary | ICD-10-CM

## 2016-11-09 ENCOUNTER — Other Ambulatory Visit: Payer: Self-pay | Admitting: Nurse Practitioner

## 2016-11-09 DIAGNOSIS — F411 Generalized anxiety disorder: Secondary | ICD-10-CM

## 2017-01-18 ENCOUNTER — Other Ambulatory Visit: Payer: Self-pay | Admitting: Nurse Practitioner

## 2017-01-18 DIAGNOSIS — I10 Essential (primary) hypertension: Secondary | ICD-10-CM

## 2017-01-18 DIAGNOSIS — F411 Generalized anxiety disorder: Secondary | ICD-10-CM

## 2017-01-28 ENCOUNTER — Other Ambulatory Visit: Payer: Self-pay | Admitting: Nurse Practitioner

## 2017-02-22 ENCOUNTER — Encounter: Payer: Self-pay | Admitting: Nurse Practitioner

## 2017-02-22 ENCOUNTER — Ambulatory Visit (INDEPENDENT_AMBULATORY_CARE_PROVIDER_SITE_OTHER): Payer: BLUE CROSS/BLUE SHIELD | Admitting: Nurse Practitioner

## 2017-02-22 VITALS — BP 129/86 | HR 81 | Temp 97.7°F | Ht 62.0 in | Wt 176.0 lb

## 2017-02-22 DIAGNOSIS — Z1211 Encounter for screening for malignant neoplasm of colon: Secondary | ICD-10-CM

## 2017-02-22 DIAGNOSIS — M25562 Pain in left knee: Secondary | ICD-10-CM | POA: Diagnosis not present

## 2017-02-22 DIAGNOSIS — M25561 Pain in right knee: Secondary | ICD-10-CM | POA: Diagnosis not present

## 2017-02-22 DIAGNOSIS — I10 Essential (primary) hypertension: Secondary | ICD-10-CM | POA: Diagnosis not present

## 2017-02-22 DIAGNOSIS — F3342 Major depressive disorder, recurrent, in full remission: Secondary | ICD-10-CM

## 2017-02-22 DIAGNOSIS — F411 Generalized anxiety disorder: Secondary | ICD-10-CM | POA: Diagnosis not present

## 2017-02-22 MED ORDER — LISINOPRIL 20 MG PO TABS: 20.0000 mg | ORAL_TABLET | Freq: Every day | ORAL | 1 refills | Status: DC

## 2017-02-22 MED ORDER — DICLOFENAC SODIUM 75 MG PO TBEC: 75.0000 mg | DELAYED_RELEASE_TABLET | Freq: Two times a day (BID) | ORAL | 2 refills | Status: DC

## 2017-02-22 NOTE — Progress Notes (Signed)
Subjective:    Patient ID: Kayla Vazquez, female    DOB: February 02, 1966, 51 y.o.   MRN: [id number]  Patient here today for follow up of chronic medical problems.  Outpatient Encounter Prescriptions as of 04/05/2016  Medication Sig  . estrogens conjugated, synthetic A, (CENESTIN) 0.3 MG tablet Take 0.3 mg by mouth daily.  Marland Kitchen lisinopril (PRINIVIL,ZESTRIL) 20 MG tablet Take 1 tablet (20 mg total) by mouth daily.  Marland Kitchen venlafaxine XR (EFFEXOR-XR) 150 MG 24 hr capsule TAKE 1 CAPSULE DAILY   * Her only complaint today is a headache.  Hypertension  This is a chronic problem. The current episode started more than 1 year ago. The problem is controlled. Pertinent negatives include no anxiety, chest pain, palpitations or peripheral edema. There are no associated agents to hypertension. Risk factors for coronary artery disease include stress and post-menopausal state. Past treatments include ACE inhibitors and diuretics. The current treatment provides moderate improvement. Compliance problems include diet and exercise.  There is no history of CAD/MI or CVA.  GAD/depression She is currently on effexor daily- no c/o side effects- helps her to stay calm and keeps her from worrying so much. SHe has left her husband since last visit so stress level has improved some. Thinks that she needs something added to the effexor. Depression screen [facility] 2/9 02/22/2017 05/28/2016 03/23/2016 12/03/2014 10/11/2014  Decreased Interest 2 0 0 2 0  Down, Depressed, Hopeless 0 0 0 0 0  PHQ - 2 Score 2 0 0 2 0  Altered sleeping 2 - - 3 -  Tired, decreased energy 2 - - 3 -  Change in appetite 2 - - 3 -  Feeling bad or failure about yourself  0 - - 0 -  Trouble concentrating 3 - - 0 -  Moving slowly or fidgety/restless 0 - - 0 -  Suicidal thoughts 0 - - 0 -  PHQ-9 Score 11 - - 11 -          Review of Systems  Constitutional: Negative.   HENT: Negative.   Respiratory: Negative.   Cardiovascular: Negative for chest pain and  palpitations.  Genitourinary: Negative.   Psychiatric/Behavioral: Negative.   All other systems reviewed and are negative.      Objective:   Physical Exam  Constitutional: She is oriented to person, place, and time. She appears well-developed and well-nourished.  HENT:  Nose: Nose normal.  Mouth/Throat: Oropharynx is clear and moist.  Eyes: EOM are normal.  Neck: Trachea normal, normal range of motion and full passive range of motion without pain. Neck supple. No JVD present. Carotid bruit is not present. No thyromegaly present.  Cardiovascular: Normal rate, regular rhythm, normal heart sounds and intact distal pulses.  Exam reveals no gallop and no friction rub.   No murmur heard. Pulmonary/Chest: Effort normal and breath sounds normal.  Abdominal: Soft. Bowel sounds are normal. She exhibits no distension and no mass. There is no tenderness.  Musculoskeletal: Normal range of motion.  Lymphadenopathy:    She has no cervical adenopathy.  Neurological: She is alert and oriented to person, place, and time. She has normal reflexes.  Skin: Skin is warm and dry.  Psychiatric: She has a normal mood and affect. Her behavior is normal. Judgment and thought content normal.    BP (!) 144/94   Pulse 82   Temp 97.7 F (36.5 C) (Oral)   Ht '5\' 2"'  (1.575 m)   Wt 176 lb (79.8 kg)   BMI 32.19 kg/m  Assessment & Plan:  1. Essential hypertension Low sodium diet - lisinopril (PRINIVIL,ZESTRIL) 20 MG tablet; Take 1 tablet (20 mg total) by mouth daily.  Dispense: 90 tablet; Refill: 1 - CMP14+EGFR - Lipid panel  2. Recurrent major depressive disorder, in full remission [facility]) Stress management Added wellbutrin to meds  3. GAD (generalized anxiety disorder)  4. Encounter for screening colonoscopy - Ambulatory referral to Gastroenterology  5. Acute pain of both knees - diclofenac (VOLTAREN) 75 MG EC tablet; Take 1 tablet (75 mg total) by mouth 2 (two) times daily.  Dispense: 60  tablet; Refill: 2    Labs pending Health maintenance reviewed Diet and exercise encouraged Continue all meds Follow up  In 6 months   [place], FNP

## 2017-02-22 NOTE — Patient Instructions (Signed)

## 2017-02-25 ENCOUNTER — Other Ambulatory Visit: Payer: Self-pay | Admitting: Nurse Practitioner

## 2017-02-25 DIAGNOSIS — F411 Generalized anxiety disorder: Secondary | ICD-10-CM

## 2017-02-26 ENCOUNTER — Telehealth: Payer: Self-pay | Admitting: Nurse Practitioner

## 2017-02-27 ENCOUNTER — Other Ambulatory Visit: Payer: Self-pay | Admitting: Nurse Practitioner

## 2017-02-27 MED ORDER — BUPROPION HCL ER (XL) 150 MG PO TB24: 150.0000 mg | ORAL_TABLET | Freq: Every day | ORAL | 1 refills | Status: DC

## 2017-06-09 ENCOUNTER — Other Ambulatory Visit: Payer: Self-pay | Admitting: Nurse Practitioner

## 2017-06-09 DIAGNOSIS — F411 Generalized anxiety disorder: Secondary | ICD-10-CM

## 2017-07-29 ENCOUNTER — Emergency Department (HOSPITAL_COMMUNITY)
Admission: EM | Admit: 2017-07-29 | Discharge: 2017-07-30 | Disposition: A | Discharge status: Home / Self Care | Payer: BLUE CROSS/BLUE SHIELD | Attending: Emergency Medicine | Admitting: Emergency Medicine

## 2017-07-29 ENCOUNTER — Encounter (HOSPITAL_COMMUNITY): Payer: Self-pay

## 2017-07-29 ENCOUNTER — Emergency Department (HOSPITAL_COMMUNITY): Payer: BLUE CROSS/BLUE SHIELD

## 2017-07-29 DIAGNOSIS — Z79899 Other long term (current) drug therapy: Secondary | ICD-10-CM | POA: Diagnosis not present

## 2017-07-29 DIAGNOSIS — S6992XA Unspecified injury of left wrist, hand and finger(s), initial encounter: Secondary | ICD-10-CM | POA: Diagnosis present

## 2017-07-29 DIAGNOSIS — Y33XXXA Other specified events, undetermined intent, initial encounter: Secondary | ICD-10-CM | POA: Insufficient documentation

## 2017-07-29 DIAGNOSIS — I10 Essential (primary) hypertension: Secondary | ICD-10-CM | POA: Diagnosis not present

## 2017-07-29 DIAGNOSIS — Y929 Unspecified place or not applicable: Secondary | ICD-10-CM | POA: Insufficient documentation

## 2017-07-29 DIAGNOSIS — Y998 Other external cause status: Secondary | ICD-10-CM | POA: Diagnosis not present

## 2017-07-29 DIAGNOSIS — Y939 Activity, unspecified: Secondary | ICD-10-CM | POA: Insufficient documentation

## 2017-07-29 DIAGNOSIS — S60222A Contusion of left hand, initial encounter: Secondary | ICD-10-CM | POA: Insufficient documentation

## 2017-07-29 NOTE — ED Triage Notes (Signed)
Reports of hitting left hand on door frame yesterday. Swelling noted. Cms intact.

## 2017-07-29 NOTE — ED Provider Notes (Signed)
[facility] DEPT Provider Note   CSN: [id number] Arrival date & time: 07/29/17  2148     History   Chief Complaint Chief Complaint  Patient presents with  . Hand Injury    HPI Kayla Vazquez is a 51 y.o. female.  Patient is a 51 year old female who presents to the emergency department with a complaint of left hand pain.  Patient states that on yesterday August 12 door frame accidentally with the left hand. She noted swelling and bruising. This got worse during the night last night and continued to get worse during the day today. The patient presents to the emergency department she's concerned about changing colors her hand and pain with attempting to grip and grasp.      Past Medical History:  Diagnosis Date  . Hypertension     Patient Active Problem List   Diagnosis Date Noted  . Essential hypertension 03/23/2016  . GAD (generalized anxiety disorder) 08/30/2014  . Depression 08/30/2014    Past Surgical History:  Procedure Laterality Date  . BREAST REDUCTION SURGERY    . ENDOMETRIAL ABLATION    . LITHOTRIPSY    . NASAL SEPTUM SURGERY      OB History    No data available       Home Medications    Prior to Admission medications   Medication Sig Start Date End Date Taking? Authorizing Provider  AMABELZ 0.5-0.1 MG tablet Take 1 tablet by mouth daily. 08/21/16   [provider]  buPROPion (WELLBUTRIN XL) 150 MG 24 hr tablet Take 1 tablet (150 mg total) by mouth daily. 02/27/17   Daphine Deutscher, Mary-Margaret, FNP  diclofenac (VOLTAREN) 75 MG EC tablet Take 1 tablet (75 mg total) by mouth 2 (two) times daily. 02/22/17   Bennie Pierini, FNP  estradiol (ESTRACE) 0.5 MG tablet TAKE 1 TABLET DAILY 06/10/17   Daphine Deutscher, Mary-Margaret, FNP  lisinopril (PRINIVIL,ZESTRIL) 20 MG tablet Take 1 tablet (20 mg total) by mouth daily. 02/22/17   Bennie Pierini, FNP  venlafaxine XR (EFFEXOR-XR) 150 MG 24 hr capsule TAKE 2 CAPSULES (=300MG )   DAILY (TAKES PLACE OF  150MG ONCE DAILY) 06/10/17   Bennie Pierini, FNP    Family History Family History  Problem Relation Age of Onset  . Cancer Mother        Breast    Social History Social History  Substance Use Topics  . Smoking status: Never Smoker  . Smokeless tobacco: Never Used  . Alcohol use Yes     Comment: rare     Allergies   Demerol [meperidine] and Penicillins   Review of Systems Review of Systems  Constitutional: Negative for activity change.       All ROS Neg except as noted in HPI  HENT: Negative for nosebleeds.   Eyes: Negative for photophobia and discharge.  Respiratory: Negative for cough, shortness of breath and wheezing.   Cardiovascular: Negative for chest pain and palpitations.  Gastrointestinal: Negative for abdominal pain and blood in stool.  Genitourinary: Negative for dysuria, frequency and hematuria.  Musculoskeletal: Positive for arthralgias. Negative for back pain and neck pain.  Skin: Negative.   Neurological: Negative for dizziness, seizures and speech difficulty.  Psychiatric/Behavioral: Negative for confusion and hallucinations.     Physical Exam Updated Vital Signs BP (!) 151/89 Comment: has not taken bp mediation tonight  Pulse (!) 106   Temp 97.8 F (36.6 C) (Oral)   Resp 15   Ht 5\' 2"  (1.575 m)   Wt 77.1 kg (170 lb)  SpO2 98%   BMI 31.09 kg/m   Physical Exam  Constitutional: She is oriented to person, place, and time. She appears well-developed and well-nourished.  Non-toxic appearance.  HENT:  Head: Normocephalic.  Right Ear: Tympanic membrane and external ear normal.  Left Ear: Tympanic membrane and external ear normal.  Eyes: Pupils are equal, round, and reactive to light. EOM and lids are normal.  Neck: Normal range of motion. Neck supple. Carotid bruit is not present.  Cardiovascular: Normal rate, regular rhythm, normal heart sounds, intact distal pulses and normal pulses.   Pulmonary/Chest: Breath sounds normal. No respiratory  distress.  Abdominal: Soft. Bowel sounds are normal. There is no tenderness. There is no guarding.  Musculoskeletal: Normal range of motion.  There is bruise to the dorsum of the left hand. At the MP joint of the second and third finger on. The bruise extends to the PIP joint of the index finger on the left. The capillary refill is less than 2 seconds. The radial pulse is 2+.  Lymphadenopathy:       Head (right side): No submandibular adenopathy present.       Head (left side): No submandibular adenopathy present.    She has no cervical adenopathy.  Neurological: She is alert and oriented to person, place, and time. She has normal strength. No cranial nerve deficit or sensory deficit.  Skin: Skin is warm and dry.  Psychiatric: She has a normal mood and affect. Her speech is normal.  Nursing note and vitals reviewed.    ED Treatments / Results  Labs (all labs ordered are listed, but only abnormal results are displayed) Labs Reviewed - No data to display  EKG  EKG Interpretation None       Radiology Dg Hand Complete Left  Result Date: 07/29/2017 CLINICAL DATA:  Injury 2 dorsal hand on door frame. EXAM: LEFT HAND - COMPLETE 3+ VIEW COMPARISON:  None. FINDINGS: No fracture or dislocation. Mild dorsal soft tissue swelling over the metacarpals. Osseous irregularities at the medial aspect of the wrist are likely degenerative. IMPRESSION: No fracture or dislocation of the left hand, specifically the third metacarpal. Electronically Signed   By: Deatra RobinsonKevin  Herman M.D.   On: 07/29/2017 22:25    Procedures Procedures (including critical care time)  Medications Ordered in ED Medications - No data to display   Initial Impression / Assessment and Plan / ED Course  I have reviewed the triage vital signs and the nursing notes.  Pertinent labs & imaging results that were available during my care of the patient were reviewed by me and considered in my medical decision making (see chart for  details).      Final Clinical Impressions(s) / ED Diagnoses MDM No gross neurovascular deficits. Xray is negative for acute problem. Suspect contusion. Discussed contusion, swelling and color changes with the patient in terms she understands. Pt willuse ice and elevation. She will return to ED if any changes or problem.   Final diagnoses:  Contusion of left hand, initial encounter    New Prescriptions New Prescriptions   No medications on file     Ivery QualeBryant, Nesanel Aguila, Cordelia Poche-C 07/29/17 2358    Vanetta MuldersZackowski, Scott, MD 08/02/17 [phone]

## 2017-07-29 NOTE — Discharge Instructions (Signed)
Your xray is negative for acute fracture. Apply ice and elevate your hand as much as possible. Use tylenol and ibuprofen for pain and discomfort.

## 2017-09-24 ENCOUNTER — Telehealth: Payer: Self-pay | Admitting: Nurse Practitioner

## 2017-09-25 ENCOUNTER — Encounter: Payer: Self-pay | Admitting: Family

## 2017-09-25 ENCOUNTER — Ambulatory Visit (INDEPENDENT_AMBULATORY_CARE_PROVIDER_SITE_OTHER): Payer: BLUE CROSS/BLUE SHIELD | Admitting: Family

## 2017-09-25 VITALS — BP 118/80 | HR 100 | Temp 98.2°F | Wt 174.2 lb

## 2017-09-25 DIAGNOSIS — I10 Essential (primary) hypertension: Secondary | ICD-10-CM

## 2017-09-25 DIAGNOSIS — F411 Generalized anxiety disorder: Secondary | ICD-10-CM

## 2017-09-25 DIAGNOSIS — K649 Unspecified hemorrhoids: Secondary | ICD-10-CM | POA: Diagnosis not present

## 2017-09-25 MED ORDER — BUPROPION HCL ER (XL) 150 MG PO TB24: 150.0000 mg | ORAL_TABLET | Freq: Every day | ORAL | 1 refills | Status: DC

## 2017-09-25 MED ORDER — HYDROCORTISONE ACETATE 25 MG RE SUPP: 25.0000 mg | Freq: Two times a day (BID) | RECTAL | 0 refills | Status: DC

## 2017-09-25 MED ORDER — VENLAFAXINE HCL ER 150 MG PO CP24: ORAL_CAPSULE | ORAL | 0 refills | Status: DC

## 2017-09-25 MED ORDER — LISINOPRIL 20 MG PO TABS: 20.0000 mg | ORAL_TABLET | Freq: Every day | ORAL | 1 refills | Status: DC

## 2017-09-25 NOTE — Progress Notes (Signed)
   Subjective:    Patient ID: Kayla Vazquez, female    DOB: 1966/04/05, 51 y.o.   MRN: [id number]  Hypertension  This is a chronic problem. The current episode started more than 1 year ago. The problem has been resolved since onset. The problem is controlled. Associated symptoms include anxiety. Pertinent negatives include no malaise/fatigue, peripheral edema or shortness of breath. Past treatments include ACE inhibitors. The current treatment provides moderate improvement. There is no history of kidney disease, CVA or heart failure.  Anxiety  Presents for follow-up visit. Symptoms include excessive worry, irritability and nervous/anxious behavior. Patient reports no decreased concentration or shortness of breath. Symptoms occur most days. The quality of sleep is fair.     PT presents to the office today with bright red blood from rectum. PT states she has had the "stomach bug" Thursday night with diarrhea of 5-8 times.   Pt states the diarrhea resolved Friday evening and had not had another BM since this morning. Reports that she did have mild bright red blood when she wiped.   Pt states she has moderate amount of pain when she had her BM this morning.   Review of Systems  Constitutional: Positive for irritability. Negative for malaise/fatigue.  Respiratory: Negative for shortness of breath.   Psychiatric/Behavioral: Negative for decreased concentration. The patient is nervous/anxious.   All other systems reviewed and are negative.      Objective:   Physical Exam  Constitutional: She is oriented to person, place, and time. She appears well-developed and well-nourished. No distress.  HENT:  Head: Normocephalic.  Eyes: Pupils are equal, round, and reactive to light.  Neck: Normal range of motion. Neck supple. No thyromegaly present.  Cardiovascular: Normal rate, regular rhythm, normal heart sounds and intact distal pulses.   No murmur heard. Pulmonary/Chest: Effort normal and breath  sounds normal. No respiratory distress. She has no wheezes.  Abdominal: Soft. Bowel sounds are normal. She exhibits no distension. There is no tenderness.  Musculoskeletal: Normal range of motion. She exhibits no edema or tenderness.  Neurological: She is alert and oriented to person, place, and time.  Skin: Skin is warm and dry.  Psychiatric: She has a normal mood and affect. Her behavior is normal. Judgment and thought content normal.  Vitals reviewed.     BP 118/80   Pulse 100   Temp 98.2 F (36.8 C) (Oral)   Wt 174 lb 3.2 oz (79 kg)   BMI 31.86 kg/m      Assessment & Plan:  1. Hemorrhoids, unspecified hemorrhoid type Daily stool softener Do not strain Force fluids Start probiotic  - hydrocortisone (ANUSOL-HC) 25 MG suppository; Place 1 suppository (25 mg total) rectally 2 (two) times daily.  Dispense: 12 suppository; Refill: 0  2. Essential hypertension -Dash diet information given -Exercise encouraged - Stress Management  -Continue current meds - lisinopril (PRINIVIL,ZESTRIL) 20 MG tablet; Take 1 tablet (20 mg total) by mouth daily.  Dispense: 90 tablet; Refill: 1  3. GAD (generalized anxiety disorder) Will reorder pt's Wellbutrin today and continue Effexor - venlafaxine XR (EFFEXOR-XR) 150 MG 24 hr capsule; TAKE 2 CAPSULES (=300MG )   DAILY (TAKES PLACE OF ONCE DAILY)  Dispense: 180 capsule; Refill: 0  Keep follow up appt with PCP  Jannifer Rodney, FNP

## 2017-09-25 NOTE — Patient Instructions (Signed)
Hemorrhoids Hemorrhoids are swollen veins in and around the rectum or anus. There are two types of hemorrhoids:  Internal hemorrhoids. These occur in the veins that are just inside the rectum. They may poke through to the outside and become irritated and painful.  External hemorrhoids. These occur in the veins that are outside of the anus and can be felt as a painful swelling or hard lump near the anus.  Most hemorrhoids do not cause serious problems, and they can be managed with home treatments such as diet and lifestyle changes. If home treatments do not help your symptoms, procedures can be done to shrink or remove the hemorrhoids. What are the causes? This condition is caused by increased pressure in the anal area. This pressure may result from various things, including:  Constipation.  Straining to have a bowel movement.  Diarrhea.  Pregnancy.  Obesity.  Sitting for long periods of time.  Heavy lifting or other activity that causes you to strain.  Anal sex.  What are the signs or symptoms? Symptoms of this condition include:  Pain.  Anal itching or irritation.  Rectal bleeding.  Leakage of stool (feces).  Anal swelling.  One or more lumps around the anus.  How is this diagnosed? This condition can often be diagnosed through a visual exam. Other exams or tests may also be done, such as:  Examination of the rectal area with a gloved hand (digital rectal exam).  Examination of the anal canal using a small tube (anoscope).  A blood test, if you have lost a significant amount of blood.  A test to look inside the colon (sigmoidoscopy or colonoscopy).  How is this treated? This condition can usually be treated at home. However, various procedures may be done if dietary changes, lifestyle changes, and other home treatments do not help your symptoms. These procedures can help make the hemorrhoids smaller or remove them completely. Some of these procedures involve  surgery, and others do not. Common procedures include:  Rubber band ligation. Rubber bands are placed at the base of the hemorrhoids to cut off the blood supply to them.  Sclerotherapy. Medicine is injected into the hemorrhoids to shrink them.  Infrared coagulation. A type of light energy is used to get rid of the hemorrhoids.  Hemorrhoidectomy surgery. The hemorrhoids are surgically removed, and the veins that supply them are tied off.  Stapled hemorrhoidopexy surgery. A circular stapling device is used to remove the hemorrhoids and use staples to cut off the blood supply to them.  Follow these instructions at home: Eating and drinking  Eat foods that have a lot of fiber in them, such as whole grains, beans, nuts, fruits, and vegetables. Ask your health care provider about taking products that have added fiber (fiber supplements).  Drink enough fluid to keep your urine clear or pale yellow. Managing pain and swelling  Take warm sitz baths for 20 minutes, 3-4 times a day to ease pain and discomfort.  If directed, apply ice to the affected area. Using ice packs between sitz baths may be helpful. ? Put ice in a plastic bag. ? Place a towel between your skin and the bag. ? Leave the ice on for 20 minutes, 2-3 times a day. General instructions  Take over-the-counter and prescription medicines only as told by your health care provider.  Use medicated creams or suppositories as told.  Exercise regularly.  Go to the bathroom when you have the urge to have a bowel movement. Do not wait.    Avoid straining to have bowel movements.  Keep the anal area dry and clean. Use wet toilet paper or moist towelettes after a bowel movement.  Do not sit on the toilet for long periods of time. This increases blood pooling and pain. Contact a health care provider if:  You have increasing pain and swelling that are not controlled by treatment or medicine.  You have uncontrolled bleeding.  You  have difficulty having a bowel movement, or you are unable to have a bowel movement.  You have pain or inflammation outside the area of the hemorrhoids. This information is not intended to replace advice given to you by your health care provider. Make sure you discuss any questions you have with your health care provider. Document Released: 11/30/2000 Document Revised: 05/02/2016 Document Reviewed: 08/17/2015 Elsevier Interactive Patient Education  2017 [employer]  

## 2017-12-23 ENCOUNTER — Ambulatory Visit: Payer: BLUE CROSS/BLUE SHIELD | Admitting: Nurse Practitioner

## 2017-12-23 ENCOUNTER — Encounter: Payer: Self-pay | Admitting: Nurse Practitioner

## 2017-12-23 VITALS — BP 128/87 | HR 109 | Temp 96.9°F | Ht 62.0 in | Wt 166.0 lb

## 2017-12-23 DIAGNOSIS — F3342 Major depressive disorder, recurrent, in full remission: Secondary | ICD-10-CM

## 2017-12-23 DIAGNOSIS — I10 Essential (primary) hypertension: Secondary | ICD-10-CM | POA: Diagnosis not present

## 2017-12-23 DIAGNOSIS — F411 Generalized anxiety disorder: Secondary | ICD-10-CM

## 2017-12-23 MED ORDER — VENLAFAXINE HCL ER 150 MG PO CP24: ORAL_CAPSULE | ORAL | 1 refills | Status: DC

## 2017-12-23 MED ORDER — BUPROPION HCL ER (XL) 150 MG PO TB24: 150.0000 mg | ORAL_TABLET | Freq: Every day | ORAL | 1 refills | Status: DC

## 2017-12-23 MED ORDER — LISINOPRIL 20 MG PO TABS: 20.0000 mg | ORAL_TABLET | Freq: Every day | ORAL | 1 refills | Status: DC

## 2017-12-23 NOTE — Progress Notes (Signed)
   Subjective:    Patient ID: Kayla Vazquez, female    DOB: 02-Sep-1966, 52 y.o.   MRN: [id number]  HPI    Review of Systems     Objective:   Physical Exam        Assessment & Plan:

## 2017-12-23 NOTE — Progress Notes (Signed)
Subjective:    Patient ID: Kayla Vazquez, female    DOB: May 27, 1966, 52 y.o.   MRN: [id number]  HPI  Kayla Vazquez is here today for follow up of chronic medical problem.  Outpatient Encounter Medications as of 12/23/2017  Medication Sig  . AMABELZ 0.5-0.1 MG tablet Take 1 tablet by mouth daily.  Marland Kitchen buPROPion (WELLBUTRIN XL) 150 MG 24 hr tablet Take 1 tablet (150 mg total) by mouth daily.  . diclofenac (VOLTAREN) 75 MG EC tablet Take 1 tablet (75 mg total) by mouth 2 (two) times daily.  Marland Kitchen lisinopril (PRINIVIL,ZESTRIL) 20 MG tablet Take 1 tablet (20 mg total) by mouth daily.  Marland Kitchen venlafaxine XR (EFFEXOR-XR) 150 MG 24 hr capsule TAKE 2 CAPSULES (=300MG)   DAILY (TAKES PLACE OF 150MGONCE DAILY)    1. Essential hypertension  No c/o chest pain, sob or headache. Does not check blood pressures at home. BP Readings from Last 3 Encounters:  12/23/17 128/87  09/25/17 118/80  07/29/17 (!) 151/89     2. GAD (generalized anxiety disorder)  Is on wellbutrin and effexor- keeps her from worrying.  3. Recurrent major depressive disorder, in full remission [facility])  Again she is on wellbutrin and effexor Depression screen [facility] 2/9 12/23/2017 09/25/2017 02/22/2017 05/28/2016 03/23/2016  Decreased Interest _0 0 0  Down, Depressed, Hopeless 2 3 0 0 0  PHQ - 2 Score _1 0 0  Altered sleeping - 3 2 - -  Tired, decreased energy _2 - -  Change in appetite 0 1 2 - -  Feeling bad or failure about yourself  1 3 0 - -  Trouble concentrating _3 - -  Moving slowly or fidgety/restless 3 1 0 - -  Suicidal thoughts 1 0 0 - -  PHQ-9 Score - 20 11 - -       New complaints: No new complaints  Social history: Lives with daughter who has a lot of emotional problems and is currently in therapy.    Review of Systems  Constitutional: Negative for activity change and appetite change.  HENT: Negative.   Eyes: Negative for pain.  Respiratory: Negative for shortness of breath.   Cardiovascular: Negative  for chest pain, palpitations and leg swelling.  Gastrointestinal: Negative for abdominal pain.  Endocrine: Negative for polydipsia.  Genitourinary: Negative.   Skin: Negative for rash.  Neurological: Negative for dizziness, weakness and headaches.  Hematological: Does not bruise/bleed easily.  Psychiatric/Behavioral: Negative.   All other systems reviewed and are negative.      Objective:   Physical Exam  Constitutional: She is oriented to person, place, and time. She appears well-developed and well-nourished.  HENT:  Nose: Nose normal.  Mouth/Throat: Oropharynx is clear and moist.  Eyes: EOM are normal.  Neck: Trachea normal, normal range of motion and full passive range of motion without pain. Neck supple. No JVD present. Carotid bruit is not present. No thyromegaly present.  Cardiovascular: Normal rate, regular rhythm, normal heart sounds and intact distal pulses. Exam reveals no gallop and no friction rub.  No murmur heard. Pulmonary/Chest: Effort normal and breath sounds normal.  Abdominal: Soft. Bowel sounds are normal. She exhibits no distension and no mass. There is no tenderness.  Musculoskeletal: Normal range of motion.  Lymphadenopathy:    She has no cervical adenopathy.  Neurological: She is alert and oriented to person, place, and time. She has normal reflexes.  Skin: Skin is warm and dry.  Psychiatric: She has a normal mood and affect. Her behavior is normal. Judgment and thought content normal.   BP 128/87   Pulse (!) 109   Temp (!) 96.9 F (36.1 C) (Oral)   Ht _0  (1.575 m)   Wt 166 lb (75.3 kg)   BMI 30.36 kg/m       Assessment & Plan:  1. Essential hypertension Low sodium diet - CMP14+EGFR - Lipid panel - lisinopril (PRINIVIL,ZESTRIL) 20 MG tablet; Take 1 tablet (20 mg total) by mouth daily.  Dispense: 90 tablet; Refill: 1  2. GAD (generalized anxiety disorder) Stress management - venlafaxine XR (EFFEXOR-XR) 150 MG 24 hr capsule; TAKE 2 CAPSULES  (=300MG)   DAILY (TAKES PLACE OF 150MGONCE DAILY)  Dispense: 180 capsule; Refill: 1  3. Recurrent major depressive disorder, in full remission ([place]) - buPROPion (WELLBUTRIN XL) 150 MG 24 hr tablet; Take 1 tablet (150 mg total) by mouth daily.  Dispense: 90 tablet; Refill: 1    Labs pending Health maintenance reviewed Diet and exercise encouraged Continue all meds Follow up  In 6 months   [name], FNP

## 2017-12-23 NOTE — Patient Instructions (Signed)
Stress and Stress Management Stress is a normal reaction to life events. It is what you feel when life demands more than you are used to or more than you can handle. Some stress can be useful. For example, the stress reaction can help you catch the last bus of the day, study for a test, or meet a deadline at work. But stress that occurs too often or for too long can cause problems. It can affect your emotional health and interfere with relationships and normal daily activities. Too much stress can weaken your immune system and increase your risk for physical illness. If you already have a medical problem, stress can make it worse. What are the causes? All sorts of life events may cause stress. An event that causes stress for one person may not be stressful for another person. Major life events commonly cause stress. These may be positive or negative. Examples include losing your job, moving into a new home, getting married, having a baby, or losing a loved one. Less obvious life events may also cause stress, especially if they occur day after day or in combination. Examples include working long hours, driving in traffic, caring for children, being in debt, or being in a difficult relationship. What are the signs or symptoms? Stress may cause emotional symptoms including, the following:  Anxiety. This is feeling worried, afraid, on edge, overwhelmed, or out of control.  Anger. This is feeling irritated or impatient.  Depression. This is feeling sad, down, helpless, or guilty.  Difficulty focusing, remembering, or making decisions.  Stress may cause physical symptoms, including the following:  Aches and pains. These may affect your head, neck, back, stomach, or other areas of your body.  Tight muscles or clenched jaw.  Low energy or trouble sleeping.  Stress may cause unhealthy behaviors, including the following:  Eating to feel better (overeating) or skipping meals.  Sleeping too little,  too much, or both.  Working too much or putting off tasks (procrastination).  Smoking, drinking alcohol, or using drugs to feel better.  How is this diagnosed? Stress is diagnosed through an assessment by your health care provider. Your health care provider will ask questions about your symptoms and any stressful life events.Your health care provider will also ask about your medical history and may order blood tests or other tests. Certain medical conditions and medicine can cause physical symptoms similar to stress. Mental illness can cause emotional symptoms and unhealthy behaviors similar to stress. Your health care provider may refer you to a mental health professional for further evaluation. How is this treated? Stress management is the recommended treatment for stress.The goals of stress management are reducing stressful life events and coping with stress in healthy ways. Techniques for reducing stressful life events include the following:  Stress identification. Self-monitor for stress and identify what causes stress for you. These skills may help you to avoid some stressful events.  Time management. Set your priorities, keep a calendar of events, and learn to say "no." These tools can help you avoid making too many commitments.  Techniques for coping with stress include the following:  Rethinking the problem. Try to think realistically about stressful events rather than ignoring them or overreacting. Try to find the positives in a stressful situation rather than focusing on the negatives.  Exercise. Physical exercise can release both physical and emotional tension. The key is to find a form of exercise you enjoy and do it regularly.  Relaxation techniques. These relax the body and  mind. Examples include yoga, meditation, tai chi, biofeedback, deep breathing, progressive muscle relaxation, listening to music, being out in nature, journaling, and other hobbies. Again, the key is to find  one or more that you enjoy and can do regularly.  Healthy lifestyle. Eat a balanced diet, get plenty of sleep, and do not smoke. Avoid using alcohol or drugs to relax.  Strong support network. Spend time with family, friends, or other people you enjoy being around.Express your feelings and talk things over with someone you trust.  Counseling or talktherapy with a mental health professional may be helpful if you are having difficulty managing stress on your own. Medicine is typically not recommended for the treatment of stress.Talk to your health care provider if you think you need medicine for symptoms of stress. Follow these instructions at home:  Keep all follow-up visits as directed by your health care provider.  Take all medicines as directed by your health care provider. Contact a health care provider if:  Your symptoms get worse or you start having new symptoms.  You feel overwhelmed by your problems and can no longer manage them on your own. Get help right away if:  You feel like hurting yourself or someone else. This information is not intended to replace advice given to you by your health care provider. Make sure you discuss any questions you have with your health care provider. Document Released: 05/29/2001 Document Revised: 05/10/2016 Document Reviewed: 07/28/2013 Elsevier Interactive Patient Education  2017 [employer]  

## 2017-12-24 ENCOUNTER — Other Ambulatory Visit: Payer: Self-pay | Admitting: Nurse Practitioner

## 2017-12-24 LAB — CMP14+EGFR
ALT: 20 IU/L (ref 0–32)
AST: 18 IU/L (ref 0–40)
Albumin/Globulin Ratio: 2.2 (ref 1.2–2.2)
Albumin: 4.8 g/dL (ref 3.5–5.5)
Alkaline Phosphatase: 79 IU/L (ref 39–117)
BUN/Creatinine Ratio: 15 (ref 9–23)
BUN: 13 mg/dL (ref 6–24)
Bilirubin Total: 0.3 mg/dL (ref 0.0–1.2)
CO2: 23 mmol/L (ref 20–29)
Calcium: 10.5 mg/dL — ABNORMAL HIGH (ref 8.7–10.2)
Chloride: 101 mmol/L (ref 96–106)
Creatinine, Ser: 0.85 mg/dL (ref 0.57–1.00)
GFR calc Af Amer: 91 mL/min/1.73
GFR calc non Af Amer: 79 mL/min/1.73
Globulin, Total: 2.2 g/dL (ref 1.5–4.5)
Glucose: 74 mg/dL (ref 65–99)
Potassium: 4.6 mmol/L (ref 3.5–5.2)
Sodium: 140 mmol/L (ref 134–144)
Total Protein: 7 g/dL (ref 6.0–8.5)

## 2017-12-24 LAB — LIPID PANEL
Chol/HDL Ratio: 5.7 ratio — ABNORMAL HIGH (ref 0.0–4.4)
Cholesterol, Total: 251 mg/dL — ABNORMAL HIGH (ref 100–199)
HDL: 44 mg/dL
Triglycerides: 488 mg/dL — ABNORMAL HIGH (ref 0–149)

## 2017-12-24 MED ORDER — FENOFIBRATE 145 MG PO TABS: 145.0000 mg | ORAL_TABLET | Freq: Every day | ORAL | 5 refills | Status: DC

## 2017-12-26 ENCOUNTER — Other Ambulatory Visit: Payer: Self-pay | Admitting: *Deleted

## 2017-12-26 MED ORDER — FENOFIBRATE 145 MG PO TABS: 145.0000 mg | ORAL_TABLET | Freq: Every day | ORAL | 1 refills | Status: DC

## 2018-05-23 ENCOUNTER — Ambulatory Visit (INDEPENDENT_AMBULATORY_CARE_PROVIDER_SITE_OTHER): Payer: BLUE CROSS/BLUE SHIELD | Admitting: Family Medicine

## 2018-05-23 ENCOUNTER — Encounter: Payer: Self-pay | Admitting: Family Medicine

## 2018-05-23 VITALS — BP 128/84 | HR 97 | Temp 99.6°F | Ht 62.0 in | Wt 163.0 lb

## 2018-05-23 DIAGNOSIS — J309 Allergic rhinitis, unspecified: Secondary | ICD-10-CM | POA: Diagnosis not present

## 2018-05-23 NOTE — Progress Notes (Signed)
BP 128/84   Pulse 97   Temp 99.6 F (37.6 C) (Oral)   Ht 5\' 2"  (1.575 m)   Wt 163 lb (73.9 kg)   BMI 29.81 kg/m    Subjective:    Patient ID: Kayla DomCathy D Korver, female    DOB: Apr 24, 1966, 52 y.o.   MRN: [id number]  HPI Patient is a [age]yr old female who presents to the clinic with 3-week intermittent history of congestion, cough, sinus pressure, sore throat and worsening bilateral ear pain. She has been "battling pollen allergies" for the last few weeks, but has begun to notice worsening pain and pressure in her ears, which began in her right ear, followed by her left. She reports muffled hearing. She indicates sinus pressure along the frontal and maxillary sinuses, and headaches which occur "mostly at the right temple."  She has taken Goody powder, Ibuprofen and Benadryl at night, which seem to help a little. Her symptoms seem worse after "spending time outdoors." She denies taking any allergy medications such as Allegra or Zyrtec. She denies fever, chills, or NVD.  Relevant past medical, surgical, family and social history reviewed and updated as indicated. Interim medical history since our last visit reviewed. Allergies and medications reviewed and updated.  Review of Systems  Constitutional: Negative for chills, fatigue and fever.  HENT: Positive for congestion, ear pain, rhinorrhea, sinus pressure and sore throat.        Bilateral ear pain and pressure, in addition to "muffled hearing." Pain and pressure indicated along right temple, frontal and maxillary sinuses.   Respiratory: Positive for cough. Negative for shortness of breath.   Cardiovascular: Negative for chest pain.  Musculoskeletal: Negative for arthralgias.  Allergic/Immunologic: Positive for environmental allergies.       Patient reports history of "pollen allergies"  Neurological: Positive for headaches.      Per HPI unless specifically indicated above        Objective:    BP 128/84   Pulse 97   Temp 99.6 F  (37.6 C) (Oral)   Ht 5\' 2"  (1.575 m)   Wt 163 lb (73.9 kg)   BMI 29.81 kg/m   Wt Readings from Last 3 Encounters:  05/23/18 163 lb (73.9 kg)  12/23/17 166 lb (75.3 kg)  09/25/17 174 lb 3.2 oz (79 kg)    Physical Exam  Constitutional: She appears well-developed and well-nourished. No distress.  HENT:  Head: Normocephalic. Head is with raccoon's eyes.  Right Ear: Tympanic membrane is bulging. Decreased hearing is noted.  Mouth/Throat: Posterior oropharyngeal erythema present.  Right TM bulging, with some fluid noted. Left ear canal erythematous and irritated from scratching. Cobblestoning noted, likely from postnasal drip.  Eyes: Conjunctivae are normal.  Lymphadenopathy:    She has cervical adenopathy.      Assessment & Plan:  Patient presents with intermittent 3 week history of congestion, headache, sinus pressure and worsening ear pain/pressure. Patient's history and physical exam findings are most consistent with allergic sinusitis.  I have advised the patient to begin Flonase, one spray in each nostril in the morning and at at night, in addition to Claritin and Mucinex for symptomatic relief. I have also encouraged her to continue drinking plenty of fluids. If her symptoms do not improve, I have advised her to contact our office and I will prescribe a short course of Prednisone.   Problem List Items Addressed This Visit    None    Visit Diagnoses    Allergic sinusitis    -  Primary   Recommended Flonase Mucinex and nasal saline sprays and an allergy medicine, call back if worsens and will send prednisone       Follow up plan: I have advised the patient to contact our office should her symptoms worsen or not improve.   Celedonio Miyamoto, PA-S  Counseling provided for all of the vaccine components No orders of the defined types were placed in this encounter.  Patient seen and examined with Liana Crocker, PA student, agree with assessment and plan above.  Manage like  allergies, patient to call back if anything worsens or does not improve. Arville Care, MD [facility] 05/23/2018, 10:56 AM

## 2018-07-29 ENCOUNTER — Other Ambulatory Visit: Payer: Self-pay | Admitting: Nurse Practitioner

## 2018-07-29 DIAGNOSIS — I10 Essential (primary) hypertension: Secondary | ICD-10-CM

## 2018-07-29 DIAGNOSIS — F3342 Major depressive disorder, recurrent, in full remission: Secondary | ICD-10-CM

## 2018-07-29 DIAGNOSIS — F411 Generalized anxiety disorder: Secondary | ICD-10-CM

## 2018-11-22 ENCOUNTER — Other Ambulatory Visit: Payer: Self-pay | Admitting: Nurse Practitioner

## 2018-11-22 DIAGNOSIS — I10 Essential (primary) hypertension: Secondary | ICD-10-CM

## 2018-11-22 DIAGNOSIS — F3342 Major depressive disorder, recurrent, in full remission: Secondary | ICD-10-CM

## 2018-11-22 DIAGNOSIS — F411 Generalized anxiety disorder: Secondary | ICD-10-CM

## 2018-11-24 NOTE — Telephone Encounter (Signed)
Left detailed message stating refills approved this time but no further refills will be given until seen in the office. Please call office to schedule follow up appointment.

## 2018-11-24 NOTE — Telephone Encounter (Signed)
Last refill without being seen 

## 2019-01-27 LAB — HM MAMMOGRAPHY

## 2019-02-05 ENCOUNTER — Encounter: Payer: Self-pay | Admitting: Nurse Practitioner

## 2019-02-05 ENCOUNTER — Ambulatory Visit: Payer: BLUE CROSS/BLUE SHIELD | Admitting: Nurse Practitioner

## 2019-02-05 VITALS — BP 136/82 | HR 84 | Temp 97.7°F | Ht 62.0 in | Wt 165.0 lb

## 2019-02-05 DIAGNOSIS — F411 Generalized anxiety disorder: Secondary | ICD-10-CM

## 2019-02-05 DIAGNOSIS — E781 Pure hyperglyceridemia: Secondary | ICD-10-CM

## 2019-02-05 DIAGNOSIS — I10 Essential (primary) hypertension: Secondary | ICD-10-CM

## 2019-02-05 DIAGNOSIS — F3342 Major depressive disorder, recurrent, in full remission: Secondary | ICD-10-CM

## 2019-02-05 MED ORDER — FENOFIBRATE 145 MG PO TABS: 145.0000 mg | ORAL_TABLET | Freq: Every day | ORAL | 1 refills | Status: DC

## 2019-02-05 MED ORDER — LISINOPRIL 20 MG PO TABS: 20.0000 mg | ORAL_TABLET | Freq: Every day | ORAL | 1 refills | Status: DC

## 2019-02-05 MED ORDER — VILAZODONE HCL 20 MG PO TABS: 20.0000 mg | ORAL_TABLET | Freq: Every day | ORAL | 1 refills | Status: DC

## 2019-02-05 MED ORDER — BUPROPION HCL ER (XL) 150 MG PO TB24: 150.0000 mg | ORAL_TABLET | Freq: Every day | ORAL | 1 refills | Status: DC

## 2019-02-05 NOTE — Patient Instructions (Signed)

## 2019-02-05 NOTE — Progress Notes (Signed)
Subjective:    Patient ID: Kayla Vazquez, female    DOB: 06-06-1966, 53 y.o.   MRN: [id number]   Chief Complaint: Medical Management of Chronic Issues   HPI:  1. Essential hypertension  No c/o chest pain sob or headache. Does not check blood pressure at home. BP Readings from Last 3 Encounters:  02/05/19 (!) 164/97  05/23/18 128/84  12/23/17 128/87     2. GAD (generalized anxiety disorder)  Patient still experiences anxiety some days  3. Recurrent major depressive disorder, in full remission [facility])  is currently on wellbutrin and effexor daily. Says she is doing well most days. She is not doing well today because her dad died a year ago today. She has not been dealing well with his death. Depression screen [facility] 02/08/2023 02/05/2019 05/23/2018 12/23/2017  Decreased Interest 2 0 2  Down, Depressed, Hopeless 2 0 2  PHQ - 2 Score 4 0 4  Altered sleeping 2 - -  Tired, decreased energy 2 - 3  Change in appetite 2 - 0  Feeling bad or failure about yourself  2 - 1  Trouble concentrating 2 - 3  Moving slowly or fidgety/restless 2 - 3  Suicidal thoughts 0 - 1  PHQ-9 Score 16 - -       Outpatient Encounter Medications as of 02/05/2019  Medication Sig  . buPROPion (WELLBUTRIN XL) 150 MG 24 hr tablet TAKE 1 TABLET DAILY  . fenofibrate (TRICOR) 145 MG tablet TAKE 1 TABLET DAILY  . lisinopril (PRINIVIL,ZESTRIL) 20 MG tablet TAKE 1 TABLET DAILY  . venlafaxine XR (EFFEXOR-XR) 150 MG 24 hr capsule TAKE 2 CAPSULES (='300MG'$ )   DAILY (TAKES PLACE OF      '150MG'$  ONCE DAILY)       New complaints: None today  Social history: Her daughter still lives with her and is still having emotional problems.   Review of Systems  Constitutional: Negative.   HENT: Negative.   Respiratory: Negative.   Cardiovascular: Negative.   Gastrointestinal: Negative.   Genitourinary: Negative.   Neurological: Negative.   Psychiatric/Behavioral: Positive for dysphoric mood. Negative for suicidal ideas.  All other  systems reviewed and are negative.      Objective:   Physical Exam Vitals signs and nursing note reviewed.  Constitutional:      General: She is not in acute distress.    Appearance: Normal appearance. She is well-developed. She is obese.  HENT:     Head: Normocephalic.     Nose: Nose normal.  Eyes:     Pupils: Pupils are equal, round, and reactive to light.  Neck:     Musculoskeletal: Normal range of motion and neck supple.     Vascular: No carotid bruit or JVD.  Cardiovascular:     Rate and Rhythm: Normal rate and regular rhythm.     Heart sounds: Normal heart sounds.  Pulmonary:     Effort: Pulmonary effort is normal. No respiratory distress.     Breath sounds: Normal breath sounds. No wheezing or rales.  Chest:     Chest wall: No tenderness.  Abdominal:     General: Bowel sounds are normal. There is no distension or abdominal bruit.     Palpations: Abdomen is soft. There is no hepatomegaly, splenomegaly, mass or pulsatile mass.     Tenderness: There is no abdominal tenderness.  Musculoskeletal: Normal range of motion.  Lymphadenopathy:     Cervical: No cervical adenopathy.  Skin:    General: Skin is warm  and dry.  Neurological:     Mental Status: She is alert and oriented to person, place, and time.     Deep Tendon Reflexes: Reflexes are normal and symmetric.  Psychiatric:        Behavior: Behavior normal.        Thought Content: Thought content normal.        Judgment: Judgment normal.     Comments: Tearful during exam  Poor eye contact Answers all questions appropriately    BP 136/82 (BP Location: Left Arm)   Pulse 84   Temp 97.7 F (36.5 C) (Oral)   Ht _0  (1.575 m)   Wt 165 lb (74.8 kg)   BMI 30.18 kg/m       Assessment & Plan:  Kayla Vazquez comes in today with chief complaint of Medical Management of Chronic Issues   Diagnosis and orders addressed:  1. Essential hypertension Low sodium diet - lisinopril (PRINIVIL,ZESTRIL) 20 MG tablet;  Take 1 tablet (20 mg total) by mouth daily.  Dispense: 90 tablet; Refill: 1 - CMP14+EGFR  2. GAD (generalized anxiety disorder) Stress management  3. Recurrent major depressive disorder, in full remission ([place]) Wean off effexor and gon on viibryd- start viibryd starter pack and start effexor every other day for 2 weeks hen every 3 day for 2 weeks then stop. - Vilazodone HCl (VIIBRYD) 20 MG TABS; Take 1 tablet (20 mg total) by mouth daily.  Dispense: 90 tablet; Refill: 1 - buPROPion (WELLBUTRIN XL) 150 MG 24 hr tablet; Take 1 tablet (150 mg total) by mouth daily.  Dispense: 90 tablet; Refill: 1  4. Pure hypertriglyceridemia Low fat diet - fenofibrate (TRICOR) 145 MG tablet; Take 1 tablet (145 mg total) by mouth daily.  Dispense: 90 tablet; Refill: 1 - Lipid panel   Labs pending Health Maintenance reviewed Diet and exercise encouraged  Follow up plan: 3 months- sooner if viibryd is not working   [employer], [place]

## 2019-02-06 LAB — CMP14+EGFR
A/G RATIO: 2.1 (ref 1.2–2.2)
ALT: 18 IU/L (ref 0–32)
AST: 18 IU/L (ref 0–40)
Albumin: 4.7 g/dL (ref 3.8–4.9)
Alkaline Phosphatase: 54 IU/L (ref 39–117)
BUN/Creatinine Ratio: 20 (ref 9–23)
BUN: 17 mg/dL (ref 6–24)
Bilirubin Total: 0.3 mg/dL (ref 0.0–1.2)
CALCIUM: 10.9 mg/dL — AB (ref 8.7–10.2)
CO2: 23 mmol/L (ref 20–29)
CREATININE: 0.87 mg/dL (ref 0.57–1.00)
Chloride: 105 mmol/L (ref 96–106)
GFR calc Af Amer: 87 mL/min/{1.73_m2} (ref >59)
GFR, EST NON AFRICAN AMERICAN: 76 mL/min/{1.73_m2} (ref >59)
Globulin, Total: 2.2 g/dL (ref 1.5–4.5)
Glucose: 89 mg/dL (ref 65–99)
POTASSIUM: 4.8 mmol/L (ref 3.5–5.2)
Sodium: 142 mmol/L (ref 134–144)
Total Protein: 6.9 g/dL (ref 6.0–8.5)

## 2019-02-06 LAB — LIPID PANEL
CHOL/HDL RATIO: 3.8 ratio (ref 0.0–4.4)
Cholesterol, Total: 204 mg/dL — ABNORMAL HIGH (ref 100–199)
HDL: 54 mg/dL (ref >39)
LDL Calculated: 126 mg/dL — ABNORMAL HIGH (ref 0–99)
TRIGLYCERIDES: 120 mg/dL (ref 0–149)
VLDL Cholesterol Cal: 24 mg/dL (ref 5–40)

## 2019-05-07 ENCOUNTER — Encounter: Payer: Self-pay | Admitting: Nurse Practitioner

## 2019-05-07 ENCOUNTER — Other Ambulatory Visit: Payer: Self-pay

## 2019-05-07 ENCOUNTER — Ambulatory Visit: Payer: BLUE CROSS/BLUE SHIELD | Admitting: Nurse Practitioner

## 2019-05-07 VITALS — BP 112/66 | HR 87 | Temp 97.1°F | Ht 62.0 in | Wt 171.0 lb

## 2019-05-07 DIAGNOSIS — F3342 Major depressive disorder, recurrent, in full remission: Secondary | ICD-10-CM

## 2019-05-07 DIAGNOSIS — F411 Generalized anxiety disorder: Secondary | ICD-10-CM

## 2019-05-07 DIAGNOSIS — Z6830 Body mass index (BMI) 30.0-30.9, adult: Secondary | ICD-10-CM | POA: Diagnosis not present

## 2019-05-07 DIAGNOSIS — I10 Essential (primary) hypertension: Secondary | ICD-10-CM

## 2019-05-07 DIAGNOSIS — E781 Pure hyperglyceridemia: Secondary | ICD-10-CM

## 2019-05-07 MED ORDER — BUPROPION HCL ER (XL) 150 MG PO TB24: 150.0000 mg | ORAL_TABLET | Freq: Every day | ORAL | 1 refills | Status: DC

## 2019-05-07 MED ORDER — FENOFIBRATE 145 MG PO TABS: 145.0000 mg | ORAL_TABLET | Freq: Every day | ORAL | 1 refills | Status: DC

## 2019-05-07 MED ORDER — LISINOPRIL 20 MG PO TABS: 20.0000 mg | ORAL_TABLET | Freq: Every day | ORAL | 1 refills | Status: DC

## 2019-05-07 MED ORDER — VILAZODONE HCL 20 MG PO TABS: 20.0000 mg | ORAL_TABLET | Freq: Every day | ORAL | 1 refills | Status: DC

## 2019-05-07 NOTE — Progress Notes (Signed)
Subjective:    Patient ID: Kayla Vazquez, female    DOB: 04-12-1966, 52 y.o.   MRN: [id number]   Chief Complaint: Medical Management of Chronic Issues    HPI:  1. Essential hypertension No c/o chest pain, sob or headache. Does not check blood presure at home. BP Readings from Last 3 Encounters:  02/05/19 136/82  05/23/18 128/84  12/23/17 128/87     2. GAD (generalized anxiety disorder) Stays stressed out.  3. Recurrent major depressive disorder, in full remission ([place]) She is on viibyrd and wellbutrin and is doing better. Depression screen [facility] 2/9 02/05/2019 05/23/2018 12/23/2017  Decreased Interest 2 0 2  Down, Depressed, Hopeless 2 0 2  PHQ - 2 Score 4 0 4  Altered sleeping 2 - -  Tired, decreased energy 2 - 3  Change in appetite 2 - 0  Feeling bad or failure about yourself  2 - 1  Trouble concentrating 2 - 3  Moving slowly or fidgety/restless 2 - 3  Suicidal thoughts 0 - 1  PHQ-9 Score 16 - -    4. BMI 30.0-30.9,adult No recent weight changes    Outpatient Encounter Medications as of 05/07/2019  Medication Sig  . buPROPion (WELLBUTRIN XL) 150 MG 24 hr tablet Take 1 tablet (150 mg total) by mouth daily.  . fenofibrate (TRICOR) 145 MG tablet Take 1 tablet (145 mg total) by mouth daily.  Marland Kitchen lisinopril (PRINIVIL,ZESTRIL) 20 MG tablet Take 1 tablet (20 mg total) by mouth daily.  . Vilazodone HCl (VIIBRYD) 20 MG TABS Take 1 tablet (20 mg total) by mouth daily.      New complaints: None today  Social history: Her and her husband are separated and are currently selling their house.      Review of Systems  Constitutional: Negative for activity change and appetite change.  HENT: Negative.   Eyes: Negative for pain.  Respiratory: Negative for shortness of breath.   Cardiovascular: Negative for chest pain, palpitations and leg swelling.  Gastrointestinal: Negative for abdominal pain.  Endocrine: Negative for polydipsia.  Genitourinary: Negative.   Skin:  Negative for rash.  Neurological: Negative for dizziness, weakness and headaches.  Hematological: Does not bruise/bleed easily.  Psychiatric/Behavioral: Negative.   All other systems reviewed and are negative.      Objective:   Physical Exam Vitals signs and nursing note reviewed.  Constitutional:      General: She is not in acute distress.    Appearance: Normal appearance. She is well-developed.  HENT:     Head: Normocephalic.     Nose: Nose normal.  Eyes:     Pupils: Pupils are equal, round, and reactive to light.  Neck:     Musculoskeletal: Normal range of motion and neck supple.     Vascular: No carotid bruit or JVD.  Cardiovascular:     Rate and Rhythm: Normal rate and regular rhythm.     Heart sounds: Normal heart sounds.  Pulmonary:     Effort: Pulmonary effort is normal. No respiratory distress.     Breath sounds: Normal breath sounds. No wheezing or rales.  Chest:     Chest wall: No tenderness.  Abdominal:     General: Bowel sounds are normal. There is no distension or abdominal bruit.     Palpations: Abdomen is soft. There is no hepatomegaly, splenomegaly, mass or pulsatile mass.     Tenderness: There is no abdominal tenderness.  Musculoskeletal: Normal range of motion.  Lymphadenopathy:  Cervical: No cervical adenopathy.  Skin:    General: Skin is warm and dry.  Neurological:     Mental Status: She is alert and oriented to person, place, and time.     Deep Tendon Reflexes: Reflexes are normal and symmetric.  Psychiatric:        Behavior: Behavior normal.        Thought Content: Thought content normal.        Judgment: Judgment normal.    BP 112/66   Pulse 87   Temp (!) 97.1 F (36.2 C) (Oral)   Ht '5\' 2"'  (1.575 m)   Wt 171 lb (77.6 kg)   BMI 31.28 kg/m         Assessment & Plan:  SANDAR KRINKE comes in today with chief complaint of Medical Management of Chronic Issues   Diagnosis and orders addressed:  1. Essential hypertension Low  sodium diet - lisinopril (ZESTRIL) 20 MG tablet; Take 1 tablet (20 mg total) by mouth daily.  Dispense: 90 tablet; Refill: 1 - CMP14+EGFR  2. GAD (generalized anxiety disorder) Stress management  3. Recurrent major depressive disorder, in full remission ([facility]) Stress management - Vilazodone HCl (VIIBRYD) 20 MG TABS; Take 1 tablet (20 mg total) by mouth daily.  Dispense: 90 tablet; Refill: 1 - buPROPion (WELLBUTRIN XL) 150 MG 24 hr tablet; Take 1 tablet (150 mg total) by mouth daily.  Dispense: 90 tablet; Refill: 1  4. BMI 30.0-30.9,adult Discussed diet and exercise for person with BMI >25 Will recheck weight in 3-6 months  5. Pure hypertriglyceridemia Low fat diet - fenofibrate (TRICOR) 145 MG tablet; Take 1 tablet (145 mg total) by mouth daily.  Dispense: 90 tablet; Refill: 1 - Lipid panel   Labs pending Health Maintenance reviewed Diet and exercise encouraged  Follow up plan: 6 months   [place], FNP

## 2019-05-07 NOTE — Patient Instructions (Signed)

## 2019-05-08 LAB — CMP14+EGFR
ALT: 15 IU/L (ref 0–32)
AST: 14 IU/L (ref 0–40)
Albumin/Globulin Ratio: 2.3 — ABNORMAL HIGH (ref 1.2–2.2)
Albumin: 4.6 g/dL (ref 3.8–4.9)
Alkaline Phosphatase: 50 IU/L (ref 39–117)
BUN/Creatinine Ratio: 17 (ref 9–23)
BUN: 23 mg/dL (ref 6–24)
Bilirubin Total: <0.2 mg/dL (ref 0.0–1.2)
CO2: 23 mmol/L (ref 20–29)
Calcium: 11 mg/dL — ABNORMAL HIGH (ref 8.7–10.2)
Chloride: 105 mmol/L (ref 96–106)
Creatinine, Ser: 1.37 mg/dL — ABNORMAL HIGH (ref 0.57–1.00)
GFR calc Af Amer: 50 mL/min/{1.73_m2} — ABNORMAL LOW (ref >59)
GFR calc non Af Amer: 44 mL/min/{1.73_m2} — ABNORMAL LOW (ref >59)
Globulin, Total: 2 g/dL (ref 1.5–4.5)
Glucose: 79 mg/dL (ref 65–99)
Potassium: 4.9 mmol/L (ref 3.5–5.2)
Sodium: 143 mmol/L (ref 134–144)
Total Protein: 6.6 g/dL (ref 6.0–8.5)

## 2019-05-08 LAB — CBC WITH DIFFERENTIAL/PLATELET
Basophils Absolute: 0.1 10*3/uL (ref 0.0–0.2)
Basos: 1 %
EOS (ABSOLUTE): 0.2 10*3/uL (ref 0.0–0.4)
Eos: 3 %
Hematocrit: 32 % — ABNORMAL LOW (ref 34.0–46.6)
Hemoglobin: 9.7 g/dL — ABNORMAL LOW (ref 11.1–15.9)
Immature Grans (Abs): 0 10*3/uL (ref 0.0–0.1)
Immature Granulocytes: 0 %
Lymphocytes Absolute: 1.5 10*3/uL (ref 0.7–3.1)
Lymphs: 23 %
MCH: 23.7 pg — ABNORMAL LOW (ref 26.6–33.0)
MCHC: 30.3 g/dL — ABNORMAL LOW (ref 31.5–35.7)
MCV: 78 fL — ABNORMAL LOW (ref 79–97)
Monocytes Absolute: 0.7 10*3/uL (ref 0.1–0.9)
Monocytes: 11 %
Neutrophils Absolute: 3.8 10*3/uL (ref 1.4–7.0)
Neutrophils: 62 %
Platelets: 383 10*3/uL (ref 150–450)
RBC: 4.09 x10E6/uL (ref 3.77–5.28)
RDW: 14.8 % (ref 11.7–15.4)
WBC: 6.3 10*3/uL (ref 3.4–10.8)

## 2019-05-08 LAB — LIPID PANEL
Chol/HDL Ratio: 4.3 ratio (ref 0.0–4.4)
Cholesterol, Total: 184 mg/dL (ref 100–199)
HDL: 43 mg/dL (ref >39)
LDL Calculated: 105 mg/dL — ABNORMAL HIGH (ref 0–99)
Triglycerides: 181 mg/dL — ABNORMAL HIGH (ref 0–149)
VLDL Cholesterol Cal: 36 mg/dL (ref 5–40)

## 2019-05-08 LAB — THYROID PANEL WITH TSH
Free Thyroxine Index: 1.7 (ref 1.2–4.9)
T3 Uptake Ratio: 26 % (ref 24–39)
T4, Total: 6.4 ug/dL (ref 4.5–12.0)
TSH: 1.82 u[IU]/mL (ref 0.450–4.500)

## 2019-05-08 LAB — VITAMIN D 25 HYDROXY (VIT D DEFICIENCY, FRACTURES): Vit D, 25-Hydroxy: 16.9 ng/mL — ABNORMAL LOW (ref 30.0–100.0)

## 2019-11-05 ENCOUNTER — Ambulatory Visit: Payer: Self-pay | Admitting: Nurse Practitioner

## 2019-11-17 ENCOUNTER — Other Ambulatory Visit: Payer: Self-pay | Admitting: Nurse Practitioner

## 2019-11-17 ENCOUNTER — Ambulatory Visit: Payer: BC Managed Care – PPO | Admitting: Nurse Practitioner

## 2019-11-17 ENCOUNTER — Encounter: Payer: Self-pay | Admitting: Nurse Practitioner

## 2019-11-17 ENCOUNTER — Other Ambulatory Visit: Payer: Self-pay

## 2019-11-17 VITALS — BP 139/77 | HR 72 | Temp 97.5°F | Resp 20 | Ht 62.0 in | Wt 177.0 lb

## 2019-11-17 DIAGNOSIS — E781 Pure hyperglyceridemia: Secondary | ICD-10-CM

## 2019-11-17 DIAGNOSIS — I10 Essential (primary) hypertension: Secondary | ICD-10-CM | POA: Diagnosis not present

## 2019-11-17 DIAGNOSIS — F3342 Major depressive disorder, recurrent, in full remission: Secondary | ICD-10-CM

## 2019-11-17 DIAGNOSIS — F411 Generalized anxiety disorder: Secondary | ICD-10-CM | POA: Diagnosis not present

## 2019-11-17 DIAGNOSIS — Z6830 Body mass index (BMI) 30.0-30.9, adult: Secondary | ICD-10-CM

## 2019-11-17 MED ORDER — VIIBRYD 20 MG PO TABS: 20.0000 mg | ORAL_TABLET | Freq: Every day | ORAL | 1 refills | Status: DC

## 2019-11-17 MED ORDER — BUPROPION HCL ER (XL) 150 MG PO TB24: 150.0000 mg | ORAL_TABLET | Freq: Every day | ORAL | 1 refills | Status: DC

## 2019-11-17 MED ORDER — FENOFIBRATE 145 MG PO TABS: 145.0000 mg | ORAL_TABLET | Freq: Every day | ORAL | 1 refills | Status: DC

## 2019-11-17 MED ORDER — LISINOPRIL 20 MG PO TABS: 20.0000 mg | ORAL_TABLET | Freq: Every day | ORAL | 1 refills | Status: DC

## 2019-11-17 NOTE — Patient Instructions (Signed)
Managing Loss, Adult People experience loss in many different ways throughout their lives. Events such as moving, changing jobs, and losing friends can create a sense of loss. The loss may be as serious as a major health change, divorce, death of a pet, or death of a loved one. All of these types of loss are likely to create a physical and emotional reaction known as grief. Grief is the result of a major change or an absence of something or someone that you count on. Grief is a normal reaction to loss. A variety of factors can affect your grieving experience, including:  The nature of your loss.  Your relationship to what or whom you lost.  Your understanding of grief and how to manage it.  Your support system. How to manage lifestyle changes Keep to your normal routine as much as possible.  If you have trouble focusing or doing normal activities, it is acceptable to take some time away from your normal routine.  Spend time with friends and loved ones.  Eat a healthy diet, get plenty of sleep, and rest when you feel tired. How to recognize changes  The way that you deal with your grief will affect your ability to function as you normally do. When grieving, you may experience these changes:  Numbness, shock, sadness, anxiety, anger, denial, and guilt.  Thoughts about death.  Unexpected crying.  A physical sensation of emptiness in your stomach.  Problems sleeping and eating.  Tiredness (fatigue).  Loss of interest in normal activities.  Dreaming about or imagining seeing the person who died.  A need to remember what or whom you lost.  Difficulty thinking about anything other than your loss for a period of time.  Relief. If you have been expecting the loss for a while, you may feel a sense of relief when it happens. Follow these instructions at home:  Activity Express your feelings in healthy ways, such as:  Talking with others about your loss. It may be helpful to find  others who have had a similar loss, such as a support group.  Writing down your feelings in a journal.  Doing physical activities to release stress and emotional energy.  Doing creative activities like painting, sculpting, or playing or listening to music.  Practicing resilience. This is the ability to recover and adjust after facing challenges. Reading some resources that encourage resilience may help you to learn ways to practice those behaviors. General instructions  Be patient with yourself and others. Allow the grieving process to happen, and remember that grieving takes time. ? It is likely that you may never feel completely done with some grief. You may find a way to move on while still cherishing memories and feelings about your loss. ? Accepting your loss is a process. It can take months or longer to adjust.  Keep all follow-up visits as told by your health care provider. This is important. Where to find support To get support for managing loss:  Ask your health care provider for help and recommendations, such as grief counseling or therapy.  Think about joining a support group for people who are managing a loss. Where to find more information You can find more information about managing loss from:  American Society of Clinical Oncology: www.cancer.net  American Psychological Association: www.apa.org Contact a health care provider if:  Your grief is extreme and keeps getting worse.  You have ongoing grief that does not improve.  Your body shows symptoms of grief, such   as illness.  You feel depressed, anxious, or lonely. Get help right away if:  You have thoughts about hurting yourself or others. If you ever feel like you may hurt yourself or others, or have thoughts about taking your own life, get help right away. You can go to your nearest emergency department or call:  Your local emergency services (911 in the [place]  A suicide crisis helpline, such as the  National Suicide Prevention Lifeline at 1-800-273-8255. This is open 24 hours a day. Summary  Grief is the result of a major change or an absence of someone or something that you count on. Grief is a normal reaction to loss.  The depth of grief and the period of recovery depend on the type of loss and your ability to adjust to the change and process your feelings.  Processing grief requires patience and a willingness to accept your feelings and talk about your loss with people who are supportive.  It is important to find resources that work for you and to realize that people experience grief differently. There is not one grieving process that works for everyone in the same way.  Be aware that when grief becomes extreme, it can lead to more severe issues like isolation, depression, anxiety, or suicidal thoughts. Talk with your health care provider if you have any of these issues. This information is not intended to replace advice given to you by your health care provider. Make sure you discuss any questions you have with your health care provider. Document Released: 04/18/2017 Document Revised: 02/06/2019 Document Reviewed: 04/18/2017 Elsevier Patient Education  2020 Elsevier Inc.  

## 2019-11-17 NOTE — Progress Notes (Signed)
Subjective:    Patient ID: Kayla Vazquez, female    DOB: 08/17/1966, 53 y.o.   MRN: [id number]   Chief Complaint: Medical Management of Chronic Issues    HPI:  1. Essential hypertension No c/o chest pain, sob or headache. Does not check blood pressure at home. BP Readings from Last 3 Encounters:  11/17/19 139/77  05/07/19 112/66  02/05/19 136/82     2. Pure hypertriglyceridemia Does not watch diet and does very little exercise. Lab Results  Component Value Date   CHOL 184 05/07/2019   HDL 43 05/07/2019   LDLCALC 105 (H) 05/07/2019   TRIG 181 (H) 05/07/2019   CHOLHDL 4.3 05/07/2019     3. Recurrent major depressive disorder, in full remission (HCC) Is on viibryd and wellbutrin and is doing ell. No side effects form medication. Depression screen [facility] 2/9 11/17/2019 02/05/2019 05/23/2018  Decreased Interest 0 2 0  Down, Depressed, Hopeless 0 2 0  PHQ - 2 Score 0 4 0  Altered sleeping - 2 -  Tired, decreased energy - 2 -  Change in appetite - 2 -  Feeling bad or failure about yourself  - 2 -  Trouble concentrating - 2 -  Moving slowly or fidgety/restless - 2 -  Suicidal thoughts - 0 -  PHQ-9 Score - 16 -     4. GAD (generalized anxiety disorder) GAD 7 : Generalized Anxiety Score 11/17/2019  Nervous, Anxious, on Edge 3  Control/stop worrying 1  Worry too much - different things 1  Trouble relaxing 3  Restless 3  Easily annoyed or irritable 3  Afraid - awful might happen 3  Total GAD 7 Score 17  Anxiety Difficulty Not difficult at all      5. BMI 30.0-30.9,adult No recent weight chnages Wt Readings from Last 3 Encounters:  11/17/19 177 lb (80.3 kg)  05/07/19 171 lb (77.6 kg)  02/05/19 165 lb (74.8 kg)   BMI Readings from Last 3 Encounters:  11/17/19 32.37 kg/m  05/07/19 31.28 kg/m  02/05/19 30.18 kg/m       Outpatient Encounter Medications as of 11/17/2019  Medication Sig  . AMABELZ 0.5-0.1 MG tablet   . buPROPion (WELLBUTRIN XL) 150 MG 24 hr  tablet Take 1 tablet (150 mg total) by mouth daily.  . fenofibrate (TRICOR) 145 MG tablet Take 1 tablet (145 mg total) by mouth daily.  Marland Kitchen lisinopril (ZESTRIL) 20 MG tablet Take 1 tablet (20 mg total) by mouth daily.  . Vilazodone HCl (VIIBRYD) 20 MG TABS Take 1 tablet (20 mg total) by mouth daily.     Past Surgical History:  Procedure Laterality Date  . BREAST REDUCTION SURGERY    . ENDOMETRIAL ABLATION    . LITHOTRIPSY    . NASAL SEPTUM SURGERY      Family History  Problem Relation Age of Onset  . Cancer Mother        Breast    New complaints: Sad today because her step mom just passed away  Social history: Lives by herself- has gone thorough a divorce this year  Controlled substance contract: n/a    Review of Systems  Constitutional: Negative for activity change and appetite change.  HENT: Negative.   Eyes: Negative for pain.  Respiratory: Negative for shortness of breath.   Cardiovascular: Negative for chest pain, palpitations and leg swelling.  Gastrointestinal: Negative for abdominal pain.  Endocrine: Negative for polydipsia.  Genitourinary: Negative.   Skin: Negative for rash.  Neurological: Negative for  dizziness, weakness and headaches.  Hematological: Does not bruise/bleed easily.  Psychiatric/Behavioral: Negative.        Tearful today  All other systems reviewed and are negative.      Objective:   Physical Exam Vitals signs and nursing note reviewed.  Constitutional:      General: She is not in acute distress.    Appearance: Normal appearance. She is well-developed.  HENT:     Head: Normocephalic.     Nose: Nose normal.  Eyes:     Pupils: Pupils are equal, round, and reactive to light.  Neck:     Musculoskeletal: Normal range of motion and neck supple.     Vascular: No carotid bruit or JVD.  Cardiovascular:     Rate and Rhythm: Normal rate and regular rhythm.     Heart sounds: Normal heart sounds.  Pulmonary:     Effort: Pulmonary effort is  normal. No respiratory distress.     Breath sounds: Normal breath sounds. No wheezing or rales.  Chest:     Chest wall: No tenderness.  Abdominal:     General: Bowel sounds are normal. There is no distension or abdominal bruit.     Palpations: Abdomen is soft. There is no hepatomegaly, splenomegaly, mass or pulsatile mass.     Tenderness: There is no abdominal tenderness.  Musculoskeletal: Normal range of motion.  Lymphadenopathy:     Cervical: No cervical adenopathy.  Skin:    General: Skin is warm and dry.  Neurological:     Mental Status: She is alert and oriented to person, place, and time.     Deep Tendon Reflexes: Reflexes are normal and symmetric.  Psychiatric:        Thought Content: Thought content normal.        Judgment: Judgment normal.     Comments: Tearful today    BP 139/77   Pulse 72   Temp (!) 97.5 F (36.4 C) (Temporal)   Resp 20   Ht 5\' 2"  (1.575 m)   Wt 177 lb (80.3 kg)   SpO2 95%   BMI 32.37 kg/m         Assessment & Plan:  Kayla Vazquez comes in today with chief complaint of Medical Management of Chronic Issues   Diagnosis and orders addressed:  1. Essential hypertension Low sodium diet - lisinopril (ZESTRIL) 20 MG tablet; Take 1 tablet (20 mg total) by mouth daily.  Dispense: 90 tablet; Refill: 1  2. Pure hypertriglyceridemia Low fat diet - fenofibrate (TRICOR) 145 MG tablet; Take 1 tablet (145 mg total) by mouth daily.  Dispense: 90 tablet; Refill: 1  3. Recurrent major depressive disorder, in full remission (HCC) Stress managrment - Vilazodone HCl (VIIBRYD) 20 MG TABS; Take 1 tablet (20 mg total) by mouth daily.  Dispense: 90 tablet; Refill: 1 - buPROPion (WELLBUTRIN XL) 150 MG 24 hr tablet; Take 1 tablet (150 mg total) by mouth daily.  Dispense: 90 tablet; Refill: 1  4. GAD (generalized anxiety disorder)  5. BMI 30.0-30.9,adult Discussed diet and exercise for person with BMI >25 Will recheck weight in 3-6 months    Labs  pending Health Maintenance reviewed Diet and exercise encouraged  Follow up plan: 6 months   Mary-Margaret Hassell Done, FNP

## 2020-05-17 ENCOUNTER — Encounter: Payer: Self-pay | Admitting: Nurse Practitioner

## 2020-05-17 ENCOUNTER — Ambulatory Visit (INDEPENDENT_AMBULATORY_CARE_PROVIDER_SITE_OTHER): Payer: BC Managed Care – PPO | Admitting: Nurse Practitioner

## 2020-05-17 ENCOUNTER — Other Ambulatory Visit: Payer: Self-pay

## 2020-05-17 VITALS — BP 123/84 | HR 87 | Temp 98.1°F | Resp 20 | Ht 62.0 in | Wt 173.0 lb

## 2020-05-17 DIAGNOSIS — F3342 Major depressive disorder, recurrent, in full remission: Secondary | ICD-10-CM | POA: Diagnosis not present

## 2020-05-17 DIAGNOSIS — I10 Essential (primary) hypertension: Secondary | ICD-10-CM | POA: Diagnosis not present

## 2020-05-17 DIAGNOSIS — E781 Pure hyperglyceridemia: Secondary | ICD-10-CM

## 2020-05-17 DIAGNOSIS — Z6830 Body mass index (BMI) 30.0-30.9, adult: Secondary | ICD-10-CM

## 2020-05-17 DIAGNOSIS — F411 Generalized anxiety disorder: Secondary | ICD-10-CM

## 2020-05-17 MED ORDER — BUPROPION HCL ER (XL) 150 MG PO TB24: 150.0000 mg | ORAL_TABLET | Freq: Every day | ORAL | 1 refills | Status: DC

## 2020-05-17 MED ORDER — LISINOPRIL 20 MG PO TABS: 20.0000 mg | ORAL_TABLET | Freq: Every day | ORAL | 1 refills | Status: DC

## 2020-05-17 MED ORDER — FENOFIBRATE 145 MG PO TABS: 145.0000 mg | ORAL_TABLET | Freq: Every day | ORAL | 1 refills | Status: DC

## 2020-05-17 MED ORDER — VIIBRYD 20 MG PO TABS: 20.0000 mg | ORAL_TABLET | Freq: Every day | ORAL | 1 refills | Status: DC

## 2020-05-17 NOTE — Progress Notes (Signed)
Subjective:    Patient ID: Kayla Vazquez, female    DOB: 14-Apr-1966, 54 y.o.   MRN: [id number]   Chief Complaint: Medical Management of Chronic Issues    HPI:  1. Essential hypertension No c/o chest pain, sob or headache. Does not check blood pressure at home BP Readings from Last 3 Encounters:  05/17/20 123/84  11/17/19 139/77  05/07/19 112/66     2. Pure hypertriglyceridemia tries to watch diet and doe very little exercise Lab Results  Component Value Date   CHOL 184 05/07/2019   HDL 43 05/07/2019   LDLCALC 105 (H) 05/07/2019   TRIG 181 (H) 05/07/2019   CHOLHDL 4.3 05/07/2019     3. GAD (generalized anxiety disorder) Is viibryd and wellburtin combination. Say she is doing well. GAD 7 : Generalized Anxiety Score 05/17/2020 11/17/2019  Nervous, Anxious, on Edge 3 3  Control/stop worrying 3 1  Worry too much - different things 3 1  Trouble relaxing 3 3  Restless 0 3  Easily annoyed or irritable 3 3  Afraid - awful might happen 0 3  Total GAD 7 Score 15 17  Anxiety Difficulty Not difficult at all Not difficult at all      4. Recurrent major depressive disorder, in full remission ([place]) Again I on wellbutrin and viibryd combination. depression no good right now. Depression screen [facility] 2/9 05/17/2020 05/17/2020 11/17/2019  Decreased Interest 0 0 0  Down, Depressed, Hopeless 1 0 0  PHQ - 2 Score 1 0 0  Altered sleeping 0 - -  Tired, decreased energy 1 - -  Change in appetite 1 - -  Feeling bad or failure about yourself  0 - -  Trouble concentrating 3 - -  Moving slowly or fidgety/restless 0 - -  Suicidal thoughts 0 - -  PHQ-9 Score 6 - -     5. BMI 31.0-31.9,adult Weight is down 4 lbs Wt Readings from Last 3 Encounters:  05/17/20 173 lb (78.5 kg)  11/17/19 177 lb (80.3 kg)  05/07/19 171 lb (77.6 kg)   BMI Readings from Last 3 Encounters:  05/17/20 31.64 kg/m  11/17/19 32.37 kg/m  05/07/19 31.28 kg/m       Outpatient Encounter Medications as of  05/17/2020  Medication Sig   AMABELZ 0.5-0.1 MG tablet    buPROPion (WELLBUTRIN XL) 150 MG 24 hr tablet Take 1 tablet (150 mg total) by mouth daily.   fenofibrate (TRICOR) 145 MG tablet Take 1 tablet (145 mg total) by mouth daily.   lisinopril (ZESTRIL) 20 MG tablet Take 1 tablet (20 mg total) by mouth daily.   Vilazodone HCl (VIIBRYD) 20 MG TABS Take 1 tablet (20 mg total) by mouth daily.    Past Surgical History:  Procedure Laterality Date   BREAST REDUCTION SURGERY     ENDOMETRIAL ABLATION     LITHOTRIPSY     NASAL SEPTUM SURGERY      Family History  Problem Relation Age of Onset   Cancer Mother        Breast    New complaints: None today  Social history: Just recently had a new grandchild.  Controlled substance contract: n/a     Review of Systems  Constitutional: Negative for diaphoresis.  Eyes: Negative for pain.  Respiratory: Negative for shortness of breath.   Cardiovascular: Negative for chest pain, palpitations and leg swelling.  Gastrointestinal: Negative for abdominal pain.  Endocrine: Negative for polydipsia.  Skin: Negative for rash.  Neurological: Negative for dizziness,  weakness and headaches.  Hematological: Does not bruise/bleed easily.  All other systems reviewed and are negative.      Objective:   Physical Exam Vitals and nursing note reviewed.  Constitutional:      General: She is not in acute distress.    Appearance: Normal appearance. She is well-developed.  HENT:     Head: Normocephalic.     Nose: Nose normal.  Eyes:     Pupils: Pupils are equal, round, and reactive to light.  Neck:     Vascular: No carotid bruit or JVD.  Cardiovascular:     Rate and Rhythm: Normal rate and regular rhythm.     Heart sounds: Normal heart sounds.  Pulmonary:     Effort: Pulmonary effort is normal. No respiratory distress.     Breath sounds: Normal breath sounds. No wheezing or rales.  Chest:     Chest wall: No tenderness.  Abdominal:      General: Bowel sounds are normal. There is no distension or abdominal bruit.     Palpations: Abdomen is soft. There is no hepatomegaly, splenomegaly, mass or pulsatile mass.     Tenderness: There is no abdominal tenderness.  Musculoskeletal:        General: Normal range of motion.     Cervical back: Normal range of motion and neck supple.  Lymphadenopathy:     Cervical: No cervical adenopathy.  Skin:    General: Skin is warm and dry.  Neurological:     Mental Status: She is alert and oriented to person, place, and time.     Deep Tendon Reflexes: Reflexes are normal and symmetric.  Psychiatric:        Behavior: Behavior normal.        Thought Content: Thought content normal.        Judgment: Judgment normal.    Blood pressure 123/84, pulse 87, temperature 98.1 F (36.7 C), temperature source Temporal, resp. rate 20, height 5\' 2"  (1.575 m), weight 173 lb (78.5 kg), SpO2 96 %.         Assessment & Plan:  Kayla Vazquez comes in today with chief complaint of Medical Management of Chronic Issues   Diagnosis and orders addressed:  1. Essential hypertension Low sodium diet - lisinopril (ZESTRIL) 20 MG tablet; Take 1 tablet (20 mg total) by mouth daily.  Dispense: 90 tablet; Refill: 1  2. Pure hypertriglyceridemia Low fta diet - fenofibrate (TRICOR) 145 MG tablet; Take 1 tablet (145 mg total) by mouth daily.  Dispense: 90 tablet; Refill: 1  3. GAD (generalized anxiety disorder) stress management  4. Recurrent major depressive disorder, in full remission (HCC) - Vilazodone HCl (VIIBRYD) 20 MG TABS; Take 1 tablet (20 mg total) by mouth daily.  Dispense: 90 tablet; Refill: 1 - buPROPion (WELLBUTRIN XL) 150 MG 24 hr tablet; Take 1 tablet (150 mg total) by mouth daily.  Dispense: 90 tablet; Refill: 1  5. BMI 30.0-30.9,adult Discussed diet and exercise for person with BMI >25 Will recheck weight in 3-6 months   Labs pending Health Maintenance reviewed Diet and exercise  encouraged  Follow up plan: 6 month   Mary-Margaret 06-16-1997, FNP

## 2020-05-17 NOTE — Patient Instructions (Signed)
Stress, Adult Stress is a normal reaction to life events. Stress is what you feel when life demands more than you are used to, or more than you think you can handle. Some stress can be useful, such as studying for a test or meeting a deadline at work. Stress that occurs too often or for too long can cause problems. It can affect your emotional health and interfere with relationships and normal daily activities. Too much stress can weaken your body's defense system (immune system) and increase your risk for physical illness. If you already have a medical problem, stress can make it worse. What are the causes? All sorts of life events can cause stress. An event that causes stress for one person may not be stressful for another person. Major life events, whether positive or negative, commonly cause stress. Examples include:  Losing a job or starting a new job.  Losing a loved one.  Moving to a new town or home.  Getting married or divorced.  Having a baby.  Getting injured or sick. Less obvious life events can also cause stress, especially if they occur day after day or in combination with each other. Examples include:  Working long hours.  Driving in traffic.  Caring for children.  Being in debt.  Being in a difficult relationship. What are the signs or symptoms? Stress can cause emotional symptoms, including:  Anxiety. This is feeling worried, afraid, on edge, overwhelmed, or out of control.  Anger, including irritation or impatience.  Depression. This is feeling sad, down, helpless, or guilty.  Trouble focusing, remembering, or making decisions. Stress can cause physical symptoms, including:  Aches and pains. These may affect your head, neck, back, stomach, or other areas of your body.  Tight muscles or a clenched jaw.  Low energy.  Trouble sleeping. Stress can cause unhealthy behaviors, including:  Eating to feel better (overeating) or skipping meals.  Working too  much or putting off tasks.  Smoking, drinking alcohol, or using drugs to feel better. How is this diagnosed? Stress is diagnosed through an assessment by your health care provider. He or she may diagnose this condition based on:  Your symptoms and any stressful life events.  Your medical history.  Tests to rule out other causes of your symptoms. Depending on your condition, your health care provider may refer you to a specialist for further evaluation. How is this treated?  Stress management techniques are the recommended treatment for stress. Medicine is not typically recommended for the treatment of stress. Techniques to reduce your reaction to stressful life events include:  Stress identification. Monitor yourself for symptoms of stress and identify what causes stress for you. These skills may help you to avoid or prepare for stressful events.  Time management. Set your priorities, keep a calendar of events, and learn to say no. Taking these actions can help you avoid making too many commitments. Techniques for coping with stress include:  Rethinking the problem. Try to think realistically about stressful events rather than ignoring them or overreacting. Try to find the positives in a stressful situation rather than focusing on the negatives.  Exercise. Physical exercise can release both physical and emotional tension. The key is to find a form of exercise that you enjoy and do it regularly.  Relaxation techniques. These relax the body and mind. The key is to find one or more that you enjoy and use the techniques regularly. Examples include: ? Meditation, deep breathing, or progressive relaxation techniques. ? Yoga or   tai chi. ? Biofeedback, mindfulness techniques, or journaling. ? Listening to music, being out in nature, or participating in other hobbies.  Practicing a healthy lifestyle. Eat a balanced diet, drink plenty of water, limit or avoid caffeine, and get plenty of  sleep.  Having a strong support network. Spend time with family, friends, or other people you enjoy being around. Express your feelings and talk things over with someone you trust. Counseling or talk therapy with a mental health professional may be helpful if you are having trouble managing stress on your own. Follow these instructions at home: Lifestyle   Avoid drugs.  Do not use any products that contain nicotine or tobacco, such as cigarettes, e-cigarettes, and chewing tobacco. If you need help quitting, ask your health care provider.  Limit alcohol intake to no more than 1 drink a day for nonpregnant women and 2 drinks a day for men. One drink equals 12 oz of beer, 5 oz of wine, or 1 oz of hard liquor  Do not use alcohol or drugs to relax.  Eat a balanced diet that includes fresh fruits and vegetables, whole grains, lean meats, fish, eggs, and beans, and low-fat dairy. Avoid processed foods and foods high in added fat, sugar, and salt.  Exercise at least 30 minutes on 5 or more days each week.  Get 7-8 hours of sleep each night. General instructions   Practice stress management techniques as discussed with your health care provider.  Drink enough fluid to keep your urine clear or pale yellow.  Take over-the-counter and prescription medicines only as told by your health care provider.  Keep all follow-up visits as told by your health care provider. This is important. Contact a health care provider if:  Your symptoms get worse.  You have new symptoms.  You feel overwhelmed by your problems and can no longer manage them on your own. Get help right away if:  You have thoughts of hurting yourself or others. If you ever feel like you may hurt yourself or others, or have thoughts about taking your own life, get help right away. You can go to your nearest emergency department or call:  Your local emergency services (911 in the [place]  A suicide crisis helpline, such as the  [place] at (316) 250-6172. This is open 24 hours a day. Summary  Stress is a normal reaction to life events. It can cause problems if it happens too often or for too long.  Practicing stress management techniques is the best way to treat stress.  Counseling or talk therapy with a mental health professional may be helpful if you are having trouble managing stress on your own. This information is not intended to replace advice given to you by your health care provider. Make sure you discuss any questions you have with your health care provider. Document Revised: 07/03/2019 Document Reviewed: 01/23/2017 Elsevier Patient Education  [place].

## 2020-05-18 LAB — CBC WITH DIFFERENTIAL/PLATELET
Basophils Absolute: 0.1 10*3/uL (ref 0.0–0.2)
Basos: 1 %
EOS (ABSOLUTE): 0.2 10*3/uL (ref 0.0–0.4)
Eos: 3 %
Hematocrit: 41.1 % (ref 34.0–46.6)
Hemoglobin: 13.6 g/dL (ref 11.1–15.9)
Immature Grans (Abs): 0 10*3/uL (ref 0.0–0.1)
Immature Granulocytes: 0 %
Lymphocytes Absolute: 1.4 10*3/uL (ref 0.7–3.1)
Lymphs: 23 %
MCH: 30.6 pg (ref 26.6–33.0)
MCHC: 33.1 g/dL (ref 31.5–35.7)
MCV: 92 fL (ref 79–97)
Monocytes Absolute: 0.6 10*3/uL (ref 0.1–0.9)
Monocytes: 10 %
Neutrophils Absolute: 3.7 10*3/uL (ref 1.4–7.0)
Neutrophils: 63 %
Platelets: 288 10*3/uL (ref 150–450)
RBC: 4.45 x10E6/uL (ref 3.77–5.28)
RDW: 13.1 % (ref 11.7–15.4)
WBC: 6 10*3/uL (ref 3.4–10.8)

## 2020-05-18 LAB — LIPID PANEL
Chol/HDL Ratio: 5.4 ratio — ABNORMAL HIGH (ref 0.0–4.4)
Cholesterol, Total: 200 mg/dL — ABNORMAL HIGH (ref 100–199)
HDL: 37 mg/dL — ABNORMAL LOW (ref >39)
LDL Chol Calc (NIH): 99 mg/dL (ref 0–99)
Triglycerides: 382 mg/dL — ABNORMAL HIGH (ref 0–149)
VLDL Cholesterol Cal: 64 mg/dL — ABNORMAL HIGH (ref 5–40)

## 2020-05-18 LAB — CMP14+EGFR
ALT: 41 IU/L — ABNORMAL HIGH (ref 0–32)
AST: 32 IU/L (ref 0–40)
Albumin/Globulin Ratio: 2.2 (ref 1.2–2.2)
Albumin: 4.6 g/dL (ref 3.8–4.9)
Alkaline Phosphatase: 86 IU/L (ref 48–121)
BUN/Creatinine Ratio: 16 (ref 9–23)
BUN: 13 mg/dL (ref 6–24)
Bilirubin Total: 0.3 mg/dL (ref 0.0–1.2)
CO2: 24 mmol/L (ref 20–29)
Calcium: 10.3 mg/dL — ABNORMAL HIGH (ref 8.7–10.2)
Chloride: 106 mmol/L (ref 96–106)
Creatinine, Ser: 0.83 mg/dL (ref 0.57–1.00)
GFR calc Af Amer: 92 mL/min/{1.73_m2} (ref >59)
GFR calc non Af Amer: 80 mL/min/{1.73_m2} (ref >59)
Globulin, Total: 2.1 g/dL (ref 1.5–4.5)
Glucose: 89 mg/dL (ref 65–99)
Potassium: 4.6 mmol/L (ref 3.5–5.2)
Sodium: 142 mmol/L (ref 134–144)
Total Protein: 6.7 g/dL (ref 6.0–8.5)

## 2020-05-19 ENCOUNTER — Telehealth: Payer: Self-pay | Admitting: *Deleted

## 2020-05-19 NOTE — Telephone Encounter (Signed)
Pleae do prior auth because he has been on this for awhile.

## 2020-05-19 NOTE — Telephone Encounter (Signed)
Fax from [facility] Viibryd tab 20 mg Not covered by ins plan Please consider Trazodone, Nefazodone, Trintellix or obtain a PA

## 2020-10-08 ENCOUNTER — Other Ambulatory Visit: Payer: Self-pay | Admitting: Nurse Practitioner

## 2020-10-08 DIAGNOSIS — E781 Pure hyperglyceridemia: Secondary | ICD-10-CM

## 2020-10-08 DIAGNOSIS — F3342 Major depressive disorder, recurrent, in full remission: Secondary | ICD-10-CM

## 2020-10-08 DIAGNOSIS — I10 Essential (primary) hypertension: Secondary | ICD-10-CM

## 2020-11-16 ENCOUNTER — Ambulatory Visit: Payer: Self-pay | Admitting: Nurse Practitioner

## 2020-11-18 ENCOUNTER — Encounter: Payer: Self-pay | Admitting: Nurse Practitioner

## 2020-12-21 ENCOUNTER — Other Ambulatory Visit: Payer: Self-pay | Admitting: Nurse Practitioner

## 2020-12-21 DIAGNOSIS — F3342 Major depressive disorder, recurrent, in full remission: Secondary | ICD-10-CM

## 2020-12-21 DIAGNOSIS — E781 Pure hyperglyceridemia: Secondary | ICD-10-CM

## 2020-12-21 DIAGNOSIS — I10 Essential (primary) hypertension: Secondary | ICD-10-CM

## 2020-12-22 NOTE — Telephone Encounter (Signed)
MMM NTBS in January for 6 mos ckup. Mail order sent

## 2020-12-26 ENCOUNTER — Ambulatory Visit: Payer: BC Managed Care – PPO | Admitting: Nurse Practitioner

## 2020-12-26 ENCOUNTER — Other Ambulatory Visit (HOSPITAL_COMMUNITY)
Admission: RE | Admit: 2020-12-26 | Discharge: 2020-12-26 | Disposition: A | Discharge status: Home / Self Care | Payer: Self-pay | Source: Ambulatory Visit | Attending: Nurse Practitioner | Admitting: Nurse Practitioner

## 2020-12-26 ENCOUNTER — Other Ambulatory Visit: Payer: Self-pay

## 2020-12-26 ENCOUNTER — Encounter: Payer: Self-pay | Admitting: Nurse Practitioner

## 2020-12-26 VITALS — BP 128/87 | HR 95 | Temp 97.9°F | Resp 20 | Ht 62.0 in | Wt 167.0 lb

## 2020-12-26 DIAGNOSIS — F3342 Major depressive disorder, recurrent, in full remission: Secondary | ICD-10-CM | POA: Diagnosis not present

## 2020-12-26 DIAGNOSIS — E781 Pure hyperglyceridemia: Secondary | ICD-10-CM | POA: Diagnosis not present

## 2020-12-26 DIAGNOSIS — F411 Generalized anxiety disorder: Secondary | ICD-10-CM | POA: Diagnosis not present

## 2020-12-26 DIAGNOSIS — R300 Dysuria: Secondary | ICD-10-CM

## 2020-12-26 DIAGNOSIS — Z6830 Body mass index (BMI) 30.0-30.9, adult: Secondary | ICD-10-CM

## 2020-12-26 DIAGNOSIS — I10 Essential (primary) hypertension: Secondary | ICD-10-CM | POA: Diagnosis not present

## 2020-12-26 DIAGNOSIS — Z7251 High risk heterosexual behavior: Secondary | ICD-10-CM

## 2020-12-26 LAB — MICROSCOPIC EXAMINATION: RBC, Urine: NONE SEEN /hpf (ref 0–2)

## 2020-12-26 LAB — URINALYSIS, COMPLETE
Bilirubin, UA: NEGATIVE
Glucose, UA: NEGATIVE
Ketones, UA: NEGATIVE
Nitrite, UA: NEGATIVE
Protein,UA: NEGATIVE
RBC, UA: NEGATIVE
Specific Gravity, UA: 1.025 (ref 1.005–1.030)
Urobilinogen, Ur: 1 mg/dL (ref 0.2–1.0)
pH, UA: 6 (ref 5.0–7.5)

## 2020-12-26 MED ORDER — FENOFIBRATE 145 MG PO TABS: 145.0000 mg | ORAL_TABLET | Freq: Every day | ORAL | 1 refills | Status: DC

## 2020-12-26 MED ORDER — BUPROPION HCL ER (XL) 150 MG PO TB24
150.0000 mg | ORAL_TABLET | Freq: Every day | ORAL | 1 refills | Status: DC
Start: 2020-12-26 — End: 2021-05-17

## 2020-12-26 MED ORDER — BUPROPION HCL ER (XL) 300 MG PO TB24: 300.0000 mg | ORAL_TABLET | Freq: Every day | ORAL | 1 refills | Status: DC

## 2020-12-26 MED ORDER — VIIBRYD 20 MG PO TABS: 20.0000 mg | ORAL_TABLET | Freq: Every day | ORAL | 1 refills | Status: DC

## 2020-12-26 MED ORDER — LISINOPRIL 20 MG PO TABS: 20.0000 mg | ORAL_TABLET | Freq: Every day | ORAL | 1 refills | Status: DC

## 2020-12-26 MED ORDER — VIIBRYD 20 MG PO TABS
20.0000 mg | ORAL_TABLET | Freq: Every day | ORAL | 1 refills | Status: DC
Start: 2020-12-26 — End: 2020-12-26

## 2020-12-26 NOTE — Patient Instructions (Signed)
Textbook of family medicine (9th ed., pp. 1062-1073). [place], [place]: [employer].">  Stress, Adult Stress is a normal reaction to life events. Stress is what you feel when life demands more than you are used to, or more than you think you can handle. Some stress can be useful, such as studying for a test or meeting a deadline at work. Stress that occurs too often or for too long can cause problems. It can affect your emotional health and interfere with relationships and normal daily activities. Too much stress can weaken your body's defense system (immune system) and increase your risk for physical illness. If you already have a medical problem, stress can make it worse. What are the causes? All sorts of life events can cause stress. An event that causes stress for one person may not be stressful for another person. Major life events, whether positive or negative, commonly cause stress. Examples include:  Losing a job or starting a new job.  Losing a loved one.  Moving to a new town or home.  Getting married or divorced.  Having a baby.  Getting injured or sick. Less obvious life events can also cause stress, especially if they occur day after day or in combination with each other. Examples include:  Working long hours.  Driving in traffic.  Caring for children.  Being in debt.  Being in a difficult relationship. What are the signs or symptoms? Stress can cause emotional symptoms, including:  Anxiety. This is feeling worried, afraid, on edge, overwhelmed, or out of control.  Anger, including irritation or impatience.  Depression. This is feeling sad, down, helpless, or guilty.  Trouble focusing, remembering, or making decisions. Stress can cause physical symptoms, including:  Aches and pains. These may affect your head, neck, back, stomach, or other areas of your body.  Tight muscles or a clenched jaw.  Low energy.  Trouble sleeping. Stress can cause unhealthy  behaviors, including:  Eating to feel better (overeating) or skipping meals.  Working too much or putting off tasks.  Smoking, drinking alcohol, or using drugs to feel better. How is this diagnosed? Stress is diagnosed through an assessment by your health care provider. He or she may diagnose this condition based on:  Your symptoms and any stressful life events.  Your medical history.  Tests to rule out other causes of your symptoms. Depending on your condition, your health care provider may refer you to a specialist for further evaluation. How is this treated? Stress management techniques are the recommended treatment for stress. Medicine is not typically recommended for the treatment of stress. Techniques to reduce your reaction to stressful life events include:  Stress identification. Monitor yourself for symptoms of stress and identify what causes stress for you. These skills may help you to avoid or prepare for stressful events.  Time management. Set your priorities, keep a calendar of events, and learn to say no. Taking these actions can help you avoid making too many commitments. Techniques for coping with stress include:  Rethinking the problem. Try to think realistically about stressful events rather than ignoring them or overreacting. Try to find the positives in a stressful situation rather than focusing on the negatives.  Exercise. Physical exercise can release both physical and emotional tension. The key is to find a form of exercise that you enjoy and do it regularly.  Relaxation techniques. These relax the body and mind. The key is to find one or more that you enjoy and use the techniques regularly. Examples include: ?   Meditation, deep breathing, or progressive relaxation techniques. ? Yoga or tai chi. ? Biofeedback, mindfulness techniques, or journaling. ? Listening to music, being out in nature, or participating in other hobbies.  Practicing a healthy lifestyle.  Eat a balanced diet, drink plenty of water, limit or avoid caffeine, and get plenty of sleep.  Having a strong support network. Spend time with family, friends, or other people you enjoy being around. Express your feelings and talk things over with someone you trust. Counseling or talk therapy with a mental health professional may be helpful if you are having trouble managing stress on your own.   Follow these instructions at home: Lifestyle  Avoid drugs.  Do not use any products that contain nicotine or tobacco, such as cigarettes, e-cigarettes, and chewing tobacco. If you need help quitting, ask your health care provider.  Limit alcohol intake to no more than 1 drink a day for nonpregnant women and 2 drinks a day for men. One drink equals 12 oz of beer, 5 oz of wine, or 1 oz of hard liquor  Do not use alcohol or drugs to relax.  Eat a balanced diet that includes fresh fruits and vegetables, whole grains, lean meats, fish, eggs, and beans, and low-fat dairy. Avoid processed foods and foods high in added fat, sugar, and salt.  Exercise at least 30 minutes on 5 or more days each week.  Get 7-8 hours of sleep each night.   General instructions  Practice stress management techniques as discussed with your health care provider.  Drink enough fluid to keep your urine clear or pale yellow.  Take over-the-counter and prescription medicines only as told by your health care provider.  Keep all follow-up visits as told by your health care provider. This is important.   Contact a health care provider if:  Your symptoms get worse.  You have new symptoms.  You feel overwhelmed by your problems and can no longer manage them on your own. Get help right away if:  You have thoughts of hurting yourself or others. If you ever feel like you may hurt yourself or others, or have thoughts about taking your own life, get help right away. You can go to your nearest emergency department or  call:  Your local emergency services (911 in the [place]  A suicide crisis helpline, such as the National Suicide Prevention Lifeline at 1-800-273-8255. This is open 24 hours a day. Summary  Stress is a normal reaction to life events. It can cause problems if it happens too often or for too long.  Practicing stress management techniques is the best way to treat stress.  Counseling or talk therapy with a mental health professional may be helpful if you are having trouble managing stress on your own. This information is not intended to replace advice given to you by your health care provider. Make sure you discuss any questions you have with your health care provider. Document Revised: 08/19/2020 Document Reviewed: 08/19/2020 Elsevier Patient Education  2021 [employer]vier Inc.  

## 2020-12-26 NOTE — Addendum Note (Signed)
Addended by: Cleda Daub on: 12/26/2020 02:14 PM   Modules accepted: Orders

## 2020-12-26 NOTE — Progress Notes (Signed)
Subjective:    Patient ID: Kayla Vazquez, female    DOB: 03-03-1966, 55 y.o.   MRN: [id number]   Chief Complaint: Medical Management of Chronic Issues    HPI:  1. Essential hypertension No c/o chest pain, sob or headache. Dos not check blood pressure at home. BP Readings from Last 3 Encounters:  12/26/20 128/87  05/17/20 123/84  11/17/19 139/77     2. Pure hypertriglyceridemia Has been watching diet but does not do a lot of exercise. Lab Results  Component Value Date   CHOL 200 (H) 05/17/2020   HDL 37 (L) 05/17/2020   LDLCALC 99 05/17/2020   TRIG 382 (H) 05/17/2020   CHOLHDL 5.4 (H) 05/17/2020     3. GAD (generalized anxiety disorder) Has been very stressed. Her boyfriend of 3 years cheated on her na dshe has been very upset.  GAD 7 : Generalized Anxiety Score 12/26/2020 05/17/2020 11/17/2019  Nervous, Anxious, on Edge '3 3 3  ' Control/stop worrying '3 3 1  ' Worry too much - different things '3 3 1  ' Trouble relaxing 0 3 3  Restless 1 0 3  Easily annoyed or irritable 0 3 3  Afraid - awful might happen 0 0 3  Total GAD 7 Score '10 15 17  ' Anxiety Difficulty Somewhat difficult Not difficult at all Not difficult at all      4. Recurrent major depressive disorder, in full remission ([place]) Is on viibryd daily and is doing okay. Depression screen [facility] 2/9 12/26/2020 05/17/2020 05/17/2020  Decreased Interest 1 0 0  Down, Depressed, Hopeless 1 1 0  PHQ - 2 Score 2 1 0  Altered sleeping 1 0 -  Tired, decreased energy 1 1 -  Change in appetite 1 1 -  Feeling bad or failure about yourself  1 0 -  Trouble concentrating 1 3 -  Moving slowly or fidgety/restless 0 0 -  Suicidal thoughts 0 0 -  PHQ-9 Score 7 6 -  Difficult doing work/chores Not difficult at all - -  Some recent data might be hidden     5. BMI 30.0-30.9,adult No recent weight changes Wt Readings from Last 3 Encounters:  12/26/20 167 lb (75.8 kg)  05/17/20 173 lb (78.5 kg)  11/17/19 177 lb (80.3 kg)   BMI  Readings from Last 3 Encounters:  12/26/20 30.54 kg/m  05/17/20 31.64 kg/m  11/17/19 32.37 kg/m       Outpatient Encounter Medications as of 12/26/2020  Medication Sig  . AMABELZ 0.5-0.1 MG tablet   . buPROPion (WELLBUTRIN XL) 150 MG 24 hr tablet TAKE 1 TABLET DAILY  . fenofibrate (TRICOR) 145 MG tablet TAKE 1 TABLET DAILY  . lisinopril (ZESTRIL) 20 MG tablet TAKE 1 TABLET DAILY  . Vilazodone HCl (VIIBRYD) 20 MG TABS Take 1 tablet (20 mg total) by mouth daily.     Past Surgical History:  Procedure Laterality Date  . BREAST REDUCTION SURGERY    . ENDOMETRIAL ABLATION    . LITHOTRIPSY    . NASAL SEPTUM SURGERY      Family History  Problem Relation Age of Onset  . Cancer Mother        Breast    New complaints: Wants to be tested for all STD's  Social history: Lives with her daughter  Controlled substance contract: n/a     Review of Systems  Constitutional: Negative for diaphoresis.  Eyes: Negative for pain.  Respiratory: Negative for shortness of breath.   Cardiovascular: Negative for chest  pain, palpitations and leg swelling.  Gastrointestinal: Negative for abdominal pain.  Endocrine: Negative for polydipsia.  Skin: Negative for rash.  Neurological: Negative for dizziness, weakness and headaches.  Hematological: Does not bruise/bleed easily.  All other systems reviewed and are negative.      Objective:   Physical Exam Vitals and nursing note reviewed.  Constitutional:      General: She is not in acute distress.    Appearance: Normal appearance. She is well-developed and well-nourished.  HENT:     Head: Normocephalic.     Nose: Nose normal.     Mouth/Throat:     Mouth: Oropharynx is clear and moist.  Eyes:     Extraocular Movements: EOM normal.     Pupils: Pupils are equal, round, and reactive to light.  Neck:     Vascular: No carotid bruit or JVD.  Cardiovascular:     Rate and Rhythm: Normal rate and regular rhythm.     Pulses: Intact distal  pulses.     Heart sounds: Normal heart sounds.  Pulmonary:     Effort: Pulmonary effort is normal. No respiratory distress.     Breath sounds: Normal breath sounds. No wheezing or rales.  Chest:     Chest wall: No tenderness.  Abdominal:     General: Bowel sounds are normal. There is no distension or abdominal bruit. Aorta is normal.     Palpations: Abdomen is soft. There is no hepatomegaly, splenomegaly, mass or pulsatile mass.     Tenderness: There is no abdominal tenderness.  Musculoskeletal:        General: No edema. Normal range of motion.     Cervical back: Normal range of motion and neck supple.  Lymphadenopathy:     Cervical: No cervical adenopathy.  Skin:    General: Skin is warm and dry.  Neurological:     Mental Status: She is alert and oriented to person, place, and time.     Deep Tendon Reflexes: Reflexes are normal and symmetric.  Psychiatric:        Mood and Affect: Mood and affect normal.        Behavior: Behavior normal.        Thought Content: Thought content normal.        Judgment: Judgment normal.    BP 128/87   Pulse 95   Temp 97.9 F (36.6 C) (Temporal)   Resp 20   Ht '5\' 2"'  (1.575 m)   Wt 167 lb (75.8 kg)   SpO2 98%   BMI 30.54 kg/m         Assessment & Plan:  Kayla Vazquez comes in today with chief complaint of Medical Management of Chronic Issues   Diagnosis and orders addressed:  1. Essential hypertension Low sodoum diet - CBC with Differential/Platelet - CMP14+EGFR - lisinopril (ZESTRIL) 20 MG tablet; Take 1 tablet (20 mg total) by mouth daily.  Dispense: 90 tablet; Refill: 1  2. Pure hypertriglyceridemia Low fat diet - Lipid panel - fenofibrate (TRICOR) 145 MG tablet; Take 1 tablet (145 mg total) by mouth daily.  Dispense: 90 tablet; Refill: 1  3. GAD (generalized anxiety disorder) Stress management  4. Recurrent major depressive disorder, in full remission ([place]) Increased wellbutrin to 300XL daily wellbutrin XL 300 MG 24  hr tablet; Take 1 tablet (150 mg total) by mouth daily.  Dispense: 90 tablet; Refill: 1  5. BMI 30.0-30.9,adult Discussed diet and exercise for person with BMI >25 Will recheck weight in 3-6 months  6. Dysuria - Urinalysis, Complete  7. High risk heterosexual behavior Labs pending - GC/Chlamydia probe amp ([place])not at [facility] - HIV antibody (with reflex) - STD Screen (8)   Labs pending Health Maintenance reviewed Diet and exercise encouraged  Follow up plan: prn   Mary-Margaret Hassell Done, FNP

## 2020-12-27 LAB — CBC WITH DIFFERENTIAL/PLATELET
Basophils Absolute: 0 10*3/uL (ref 0.0–0.2)
Basos: 1 %
EOS (ABSOLUTE): 0.1 10*3/uL (ref 0.0–0.4)
Eos: 1 %
Hematocrit: 42.4 % (ref 34.0–46.6)
Hemoglobin: 14.4 g/dL (ref 11.1–15.9)
Immature Grans (Abs): 0 10*3/uL (ref 0.0–0.1)
Immature Granulocytes: 0 %
Lymphocytes Absolute: 1.6 10*3/uL (ref 0.7–3.1)
Lymphs: 21 %
MCH: 30.6 pg (ref 26.6–33.0)
MCHC: 34 g/dL (ref 31.5–35.7)
MCV: 90 fL (ref 79–97)
Monocytes Absolute: 0.7 10*3/uL (ref 0.1–0.9)
Monocytes: 9 %
Neutrophils Absolute: 5.2 10*3/uL (ref 1.4–7.0)
Neutrophils: 68 %
Platelets: 339 10*3/uL (ref 150–450)
RBC: 4.71 x10E6/uL (ref 3.77–5.28)
RDW: 12.5 % (ref 11.7–15.4)
WBC: 7.6 10*3/uL (ref 3.4–10.8)

## 2020-12-27 LAB — LIPID PANEL
Chol/HDL Ratio: 3.8 ratio (ref 0.0–4.4)
Cholesterol, Total: 161 mg/dL (ref 100–199)
HDL: 42 mg/dL (ref >39)
LDL Chol Calc (NIH): 97 mg/dL (ref 0–99)
Triglycerides: 125 mg/dL (ref 0–149)
VLDL Cholesterol Cal: 22 mg/dL (ref 5–40)

## 2020-12-27 LAB — CMP14+EGFR
ALT: 36 IU/L — ABNORMAL HIGH (ref 0–32)
AST: 28 IU/L (ref 0–40)
Albumin/Globulin Ratio: 2.6 — ABNORMAL HIGH (ref 1.2–2.2)
Albumin: 5.1 g/dL — ABNORMAL HIGH (ref 3.8–4.9)
Alkaline Phosphatase: 64 IU/L (ref 44–121)
BUN/Creatinine Ratio: 15 (ref 9–23)
BUN: 15 mg/dL (ref 6–24)
Bilirubin Total: 0.6 mg/dL (ref 0.0–1.2)
CO2: 24 mmol/L (ref 20–29)
Calcium: 11.4 mg/dL — ABNORMAL HIGH (ref 8.7–10.2)
Chloride: 104 mmol/L (ref 96–106)
Creatinine, Ser: 1.01 mg/dL — ABNORMAL HIGH (ref 0.57–1.00)
GFR calc Af Amer: 72 mL/min/{1.73_m2} (ref >59)
GFR calc non Af Amer: 63 mL/min/{1.73_m2} (ref >59)
Globulin, Total: 2 g/dL (ref 1.5–4.5)
Glucose: 95 mg/dL (ref 65–99)
Potassium: 4.2 mmol/L (ref 3.5–5.2)
Sodium: 141 mmol/L (ref 134–144)
Total Protein: 7.1 g/dL (ref 6.0–8.5)

## 2020-12-27 LAB — STD SCREEN (8)
HIV Screen 4th Generation wRfx: NONREACTIVE
HSV 1 Glycoprotein G Ab, IgG: 13.8 index — ABNORMAL HIGH (ref 0.00–0.90)
HSV 2 IgG, Type Spec: <0.91 index (ref 0.00–0.90)
Hep A IgM: NEGATIVE
Hep B C IgM: NEGATIVE
Hep C Virus Ab: <0.1 s/co ratio (ref 0.0–0.9)
Hepatitis B Surface Ag: NEGATIVE
RPR Ser Ql: NONREACTIVE

## 2020-12-29 LAB — URINE CYTOLOGY ANCILLARY ONLY
Candida Urine: NEGATIVE
Chlamydia: NEGATIVE
Comment: NEGATIVE
Comment: NEGATIVE
Comment: NORMAL
Neisseria Gonorrhea: NEGATIVE
Trichomonas: NEGATIVE

## 2021-01-03 ENCOUNTER — Ambulatory Visit: Payer: BC Managed Care – PPO | Admitting: Nurse Practitioner

## 2021-01-20 ENCOUNTER — Ambulatory Visit: Payer: BC Managed Care – PPO | Admitting: Nurse Practitioner

## 2021-05-15 ENCOUNTER — Other Ambulatory Visit: Payer: Self-pay | Admitting: Nurse Practitioner

## 2021-05-15 DIAGNOSIS — F3342 Major depressive disorder, recurrent, in full remission: Secondary | ICD-10-CM

## 2021-05-17 ENCOUNTER — Emergency Department (HOSPITAL_COMMUNITY): Payer: BC Managed Care – PPO

## 2021-05-17 ENCOUNTER — Other Ambulatory Visit: Payer: Self-pay

## 2021-05-17 ENCOUNTER — Observation Stay (HOSPITAL_COMMUNITY)
Admission: EM | Admit: 2021-05-17 | Discharge: 2021-05-18 | Disposition: A | Discharge status: Home / Self Care | Payer: BC Managed Care – PPO | Attending: Internal Medicine | Admitting: Internal Medicine

## 2021-05-17 DIAGNOSIS — E781 Pure hyperglyceridemia: Secondary | ICD-10-CM | POA: Diagnosis present

## 2021-05-17 DIAGNOSIS — K921 Melena: Secondary | ICD-10-CM | POA: Diagnosis not present

## 2021-05-17 DIAGNOSIS — E7800 Pure hypercholesterolemia, unspecified: Secondary | ICD-10-CM | POA: Diagnosis not present

## 2021-05-17 DIAGNOSIS — I10 Essential (primary) hypertension: Secondary | ICD-10-CM | POA: Diagnosis not present

## 2021-05-17 DIAGNOSIS — D649 Anemia, unspecified: Secondary | ICD-10-CM | POA: Diagnosis not present

## 2021-05-17 DIAGNOSIS — K250 Acute gastric ulcer with hemorrhage: Secondary | ICD-10-CM

## 2021-05-17 DIAGNOSIS — R0602 Shortness of breath: Secondary | ICD-10-CM

## 2021-05-17 DIAGNOSIS — G43909 Migraine, unspecified, not intractable, without status migrainosus: Secondary | ICD-10-CM | POA: Diagnosis not present

## 2021-05-17 DIAGNOSIS — F32A Depression, unspecified: Secondary | ICD-10-CM | POA: Diagnosis present

## 2021-05-17 DIAGNOSIS — F329 Major depressive disorder, single episode, unspecified: Secondary | ICD-10-CM | POA: Diagnosis not present

## 2021-05-17 DIAGNOSIS — K922 Gastrointestinal hemorrhage, unspecified: Secondary | ICD-10-CM | POA: Diagnosis present

## 2021-05-17 DIAGNOSIS — Z79899 Other long term (current) drug therapy: Secondary | ICD-10-CM | POA: Insufficient documentation

## 2021-05-17 DIAGNOSIS — K254 Chronic or unspecified gastric ulcer with hemorrhage: Secondary | ICD-10-CM | POA: Diagnosis not present

## 2021-05-17 DIAGNOSIS — Z20822 Contact with and (suspected) exposure to covid-19: Secondary | ICD-10-CM | POA: Insufficient documentation

## 2021-05-17 DIAGNOSIS — F411 Generalized anxiety disorder: Secondary | ICD-10-CM | POA: Diagnosis not present

## 2021-05-17 LAB — TROPONIN I (HIGH SENSITIVITY)
Troponin I (High Sensitivity): 3 ng/L (ref <18)
Troponin I (High Sensitivity): 3 ng/L (ref <18)

## 2021-05-17 LAB — HEPATIC FUNCTION PANEL
ALT: 35 U/L (ref 0–44)
AST: 24 U/L (ref 15–41)
Albumin: 4.4 g/dL (ref 3.5–5.0)
Alkaline Phosphatase: 56 U/L (ref 38–126)
Bilirubin, Direct: 0.1 mg/dL (ref 0.0–0.2)
Indirect Bilirubin: 1 mg/dL — ABNORMAL HIGH (ref 0.3–0.9)
Total Bilirubin: 1.1 mg/dL (ref 0.3–1.2)
Total Protein: 6.7 g/dL (ref 6.5–8.1)

## 2021-05-17 LAB — CBC WITH DIFFERENTIAL/PLATELET
Abs Immature Granulocytes: 0.04 10*3/uL (ref 0.00–0.07)
Basophils Absolute: 0.1 10*3/uL (ref 0.0–0.1)
Basophils Relative: 1 %
Eosinophils Absolute: 0.1 10*3/uL (ref 0.0–0.5)
Eosinophils Relative: 1 %
HCT: 34 % — ABNORMAL LOW (ref 36.0–46.0)
Hemoglobin: 11 g/dL — ABNORMAL LOW (ref 12.0–15.0)
Immature Granulocytes: 0 %
Lymphocytes Relative: 24 %
Lymphs Abs: 2.3 10*3/uL (ref 0.7–4.0)
MCH: 30.4 pg (ref 26.0–34.0)
MCHC: 32.4 g/dL (ref 30.0–36.0)
MCV: 93.9 fL (ref 80.0–100.0)
Monocytes Absolute: 0.7 10*3/uL (ref 0.1–1.0)
Monocytes Relative: 7 %
Neutro Abs: 6.6 10*3/uL (ref 1.7–7.7)
Neutrophils Relative %: 67 %
Platelets: 327 10*3/uL (ref 150–400)
RBC: 3.62 MIL/uL — ABNORMAL LOW (ref 3.87–5.11)
RDW: 12.8 % (ref 11.5–15.5)
WBC: 9.8 10*3/uL (ref 4.0–10.5)
nRBC: 0 % (ref 0.0–0.2)

## 2021-05-17 LAB — RESP PANEL BY RT-PCR (FLU A&B, COVID) ARPGX2
Influenza A by PCR: NEGATIVE
Influenza B by PCR: NEGATIVE
SARS Coronavirus 2 by RT PCR: NEGATIVE

## 2021-05-17 LAB — D-DIMER, QUANTITATIVE: D-Dimer, Quant: <0.27 ug/mL-FEU (ref 0.00–0.50)

## 2021-05-17 LAB — BASIC METABOLIC PANEL
Anion gap: 7 (ref 5–15)
BUN: 32 mg/dL — ABNORMAL HIGH (ref 6–20)
CO2: 23 mmol/L (ref 22–32)
Calcium: 10 mg/dL (ref 8.9–10.3)
Chloride: 108 mmol/L (ref 98–111)
Creatinine, Ser: 0.84 mg/dL (ref 0.44–1.00)
GFR, Estimated: >60 mL/min (ref >60)
Glucose, Bld: 101 mg/dL — ABNORMAL HIGH (ref 70–99)
Potassium: 4 mmol/L (ref 3.5–5.1)
Sodium: 138 mmol/L (ref 135–145)

## 2021-05-17 LAB — POC OCCULT BLOOD, ED: Fecal Occult Bld: POSITIVE — AB

## 2021-05-17 MED ORDER — PANTOPRAZOLE SODIUM 40 MG IV SOLR
40.0000 mg | Freq: Two times a day (BID) | INTRAVENOUS | Status: DC
  Administered 2021-05-17: 40 mg via INTRAVENOUS
  Filled 2021-05-17: qty 40

## 2021-05-17 NOTE — ED Provider Notes (Signed)
[facility] EMERGENCY DEPARTMENT Provider Note   CSN: [id number] Arrival date & time: 05/17/21  1502     History Chief Complaint  Patient presents with  . Shortness of Breath  . Abnormal ECG    Kayla Vazquez is a 55 y.o. female.  HPI   Patient is a 55 year old female with a history of anxiety, hyperlipidemia, as well as hypertension.  She presents to the emergency department due to shortness of breath.  Patient states yesterday she experienced a diffuse headache that was similar to prior headaches as well as fatigue.  She states that her symptoms resolved later in the day.  Today while she was walking at work she felt more short of breath than normal, experienced increased heart rate, fatigue, as well as central chest tightness.  She went to urgent care and was noted to have an abnormal ECG and was sent to the emergency department.  At rest she has no complaints.  Denies any leg swelling, abdominal pain, nausea, vomiting, or diarrhea.  She reports a history of iron deficiency and states that she used to take iron in the past.  Notes 1 episode of melena yesterday but no hematochezia.  No further episodes of melena today.  She takes Goody powder about 1 time per week for headaches. Denies any regular NSAID use.  She is not a smoker.  Denies any recent travel, immobilization, leg swelling, hemoptysis, history of blood clots.  She states that she used to take a low-dose oral estrogen but stopped this medication about 3 months ago.  She states she had a negative Cologuard last year but has never had a colonoscopy.     Past Medical History:  Diagnosis Date  . Anxiety   . Hyperlipidemia   . Hypertension     Patient Active Problem List   Diagnosis Date Noted  . GI bleed 05/17/2021  . BMI 30.0-30.9,adult 05/07/2019  . Pure hypertriglyceridemia 05/07/2019  . Essential hypertension 03/23/2016  . GAD (generalized anxiety disorder) 08/30/2014  . Depression 08/30/2014     Past Surgical History:  Procedure Laterality Date  . BREAST REDUCTION SURGERY    . ENDOMETRIAL ABLATION    . LITHOTRIPSY    . NASAL SEPTUM SURGERY       OB History   No obstetric history on file.     Family History  Problem Relation Age of Onset  . Cancer Mother        Breast    Social History   Tobacco Use  . Smoking status: Never Smoker  . Smokeless tobacco: Never Used  Vaping Use  . Vaping Use: Never used  Substance Use Topics  . Alcohol use: Yes    Comment: rare  . Drug use: No    Home Medications Prior to Admission medications   Medication Sig Start Date End Date Taking? Authorizing Provider  AMABELZ 0.5-0.1 MG tablet  09/11/19   [provider]  buPROPion (WELLBUTRIN XL) 150 MG 24 hr tablet Take 1 tablet (150 mg total) by mouth daily. 12/26/20   Daphine Deutscher, Mary-Margaret, FNP  buPROPion (WELLBUTRIN XL) 300 MG 24 hr tablet Take 1 tablet (300 mg total) by mouth daily. 12/26/20   Daphine Deutscher, Mary-Margaret, FNP  fenofibrate (TRICOR) 145 MG tablet Take 1 tablet (145 mg total) by mouth daily. 12/26/20   Daphine Deutscher, Mary-Margaret, FNP  lisinopril (ZESTRIL) 20 MG tablet Take 1 tablet (20 mg total) by mouth daily. 12/26/20   Daphine Deutscher Mary-Margaret, FNP  Vilazodone HCl (VIIBRYD) 20 MG  TABS Take 1 tablet (20 mg total) by mouth daily. 12/26/20   Bennie PieriniMartin, Mary-Margaret, FNP    Allergies    Demerol [meperidine] and Penicillins  Review of Systems   Review of Systems  All other systems reviewed and are negative. Ten systems reviewed and are negative for acute change, except as noted in the HPI.   Physical Exam Updated Vital Signs BP 104/62   Pulse 88   Temp 98.4 F (36.9 C) (Oral)   Resp 16   SpO2 100%   Physical Exam Vitals and nursing note reviewed.  Constitutional:      General: She is not in acute distress.    Appearance: Normal appearance. She is well-developed and normal weight. She is not ill-appearing, toxic-appearing or diaphoretic.     Interventions: She is  not intubated. HENT:     Head: Normocephalic and atraumatic.     Right Ear: External ear normal.     Left Ear: External ear normal.     Nose: Nose normal.     Mouth/Throat:     Mouth: Mucous membranes are moist.     Pharynx: Oropharynx is clear. No oropharyngeal exudate or posterior oropharyngeal erythema.  Eyes:     Extraocular Movements: Extraocular movements intact.  Cardiovascular:     Rate and Rhythm: Normal rate and regular rhythm.     Pulses: Normal pulses.     Heart sounds: Normal heart sounds. No murmur heard. No friction rub. No gallop.      Comments: RRR without M/R/G.  Pulmonary:     Effort: Pulmonary effort is normal. No tachypnea, bradypnea, accessory muscle usage or respiratory distress. She is not intubated.     Breath sounds: Normal breath sounds. No stridor. No decreased breath sounds, wheezing, rhonchi or rales.     Comments: Lungs are clear to auscultation bilaterally.  No wheezing, rales, or rhonchi. Abdominal:     General: Abdomen is flat.     Palpations: Abdomen is soft.     Tenderness: There is no abdominal tenderness.     Comments: Abdomen is flat, soft, and nontender.  Genitourinary:    Comments: Female nursing chaperone present.  Normal-appearing anal region.  No visible or palpable hemorrhoids noted.  Large amount of melanotic stool noted in the rectal vault.  No tenderness noted throughout the exam. Musculoskeletal:        General: Normal range of motion.     Cervical back: Normal range of motion and neck supple. No tenderness.  Skin:    General: Skin is warm and dry.  Neurological:     General: No focal deficit present.     Mental Status: She is alert and oriented to person, place, and time.  Psychiatric:        Mood and Affect: Mood normal.        Behavior: Behavior normal.    ED Results / Procedures / Treatments   Labs (all labs ordered are listed, but only abnormal results are displayed) Labs Reviewed  BASIC METABOLIC PANEL - Abnormal;  Notable for the following components:      Result Value   Glucose, Bld 101 (*)    BUN 32 (*)    All other components within normal limits  CBC WITH DIFFERENTIAL/PLATELET - Abnormal; Notable for the following components:   RBC 3.62 (*)    Hemoglobin 11.0 (*)    HCT 34.0 (*)    All other components within normal limits  HEPATIC FUNCTION PANEL - Abnormal; Notable for the following  components:   Indirect Bilirubin 1.0 (*)    All other components within normal limits  POC OCCULT BLOOD, ED - Abnormal; Notable for the following components:   Fecal Occult Bld POSITIVE (*)    All other components within normal limits  RESP PANEL BY RT-PCR (FLU A&B, COVID) [id number]  D-DIMER, QUANTITATIVE  OCCULT BLOOD X 1 CARD TO LAB, STOOL  TROPONIN I (HIGH SENSITIVITY)  TROPONIN I (HIGH SENSITIVITY)   EKG EKG Interpretation  Date/Time:  Wednesday May 17 2021 15:26:26 EDT Ventricular Rate:  115 PR Interval:  140 QRS Duration: 84 QT Interval:  344 QTC Calculation: 475 R Axis:   -43 Text Interpretation: Sinus tachycardia Left axis deviation Low voltage QRS Cannot rule out Inferior infarct , age undetermined Possible Anterolateral infarct , age undetermined Abnormal ECG Confirmed by Benjiman Core [phone]) on 05/17/2021 8:53:44 PM  Radiology DG Chest 2 View  Result Date: 05/17/2021 CLINICAL DATA:  Short of breath tachycardia EXAM: CHEST - 2 VIEW COMPARISON:  None. FINDINGS: The heart size and mediastinal contours are within normal limits. Both lungs are clear. The visualized skeletal structures are unremarkable. IMPRESSION: No active cardiopulmonary disease. Electronically Signed   By: Marlan Palau M.D.   On: 05/17/2021 15:57   Procedures Procedures   Medications Ordered in ED Medications  pantoprazole (PROTONIX) injection 40 mg (40 mg Intravenous Given 05/17/21 2124)    ED Course  I have reviewed the triage vital signs and the nursing notes.  Pertinent labs & imaging results that were available  during my care of the patient were reviewed by me and considered in my medical decision making (see chart for details).  Clinical Course as of 05/17/21 2136  Wed May 17, 2021  2050 Fecal Occult Blood, POC(!): POSITIVE [LJ]    Clinical Course User Index [LJ] Placido Sou, PA-C   MDM Rules/Calculators/A&P                          Pt is a 55 y.o. female who presents to the emergency department due to shortness of breath, fatigue, as well as chest tightness.  Labs: CBC with a hemoglobin of 11 and hematocrit of 34. BMP with a glucose of 101 and a BUN of 32. Hepatic function panel with an indirect bilirubin of 1. Troponin of 3. Fecal occult blood is positive.  Imaging: Chest x-ray is negative.  I, Placido Sou, PA-C, personally reviewed and evaluated these images and lab results as part of my medical decision-making.  I believe patient's symptoms today are likely secondary to symptomatic anemia.  Though her hemoglobin is only 11 today, it was 14.4 less than 4 months ago.  She had obvious melanotic stools on my exam which she states started yesterday.  She takes Marlin Canary powders about once per week but otherwise denies any regular NSAID use.  I reached out to Dr. Barron Alvine who is on-call for GI.  Requested 40 mg Protonix IV twice daily as well as keeping patient NPO.  States she will be evaluated in the morning.  Will discuss with the medicine team.  Note: Portions of this report may have been transcribed using voice recognition software. Every effort was made to ensure accuracy; however, inadvertent computerized transcription errors may be present.   Final Clinical Impression(s) / ED Diagnoses Final diagnoses:  Symptomatic anemia  Shortness of breath   Rx / DC Orders ED Discharge Orders    None       Placido Sou, PA-C  05/17/21 2136    Benjiman Core, MD 05/17/21 2325

## 2021-05-17 NOTE — ED Triage Notes (Signed)
Pt arrives from UC for eval of abnormal ekg and shob. Pt sts woke up yesterday with headache and lightheadedness and this morning while out in the yard developed shob and high heart rate. Went to UC and EKG done showed "signs of a heart attack" so was advised to come to the ED. Denies chest pain.

## 2021-05-17 NOTE — H&P (Signed)
History and Physical   Kayla Vazquez IRC:[id number] DOB: [birth year] DOA: 05/17/2021  Referring MD/NP/PA: Dr Rubin Payor   PCP: Bennie Pierini, FNP   Outpatient Specialists: None   Patient coming from: Home  Chief Complaint: Dizziness, Melena  HPI: Kayla Vazquez is a 55 y.o. female with medical history significant of depression, NSAID abuse, anxiety disorder, hyperlipidemia among other things who takes Goody's powder on and off and took some yesterday following diffuse headache.  She has recurrent headaches and diffuse fatigue and she takes Aleve or Goody's powder when needed.  Today while at work she felt more short of breath fatigue and tachycardia.  She went to urgent care center where she was found to have sinus tachycardia on EKG.  She was sent over to the ER where she was evaluated.  Patient found to have hemoglobin of 11 g.  It was 14 g last year.  Also found to have dark tarry stool with guaiac positive.  Patient also has elevated BUN.  At this point she is diagnosed with symptomatic anemia secondary to possibly upper GI bleed.  GI consulted and patient be admitted to the hospital for further evaluation and treatment..  ED Course: Temperature 98.4 blood pressure 100/69, pulse 108 respirate of 18 oxygen sat 100% on room air.  White count 9.8 hemoglobin 11.1 platelets 327.  Chemistry largely within normal.  Social work is positive with melena.  D-dimer is negative.  LFTs all within normal.  Chest x-ray showed no acute findings.  Stool guaiac positive x2.  Patient being admitted with GI bleed and symptomatic anemia  Review of Systems: As per HPI otherwise 10 point review of systems negative.    Past Medical History:  Diagnosis Date  . Anxiety   . Hyperlipidemia   . Hypertension     Past Surgical History:  Procedure Laterality Date  . BREAST REDUCTION SURGERY    . ENDOMETRIAL ABLATION    . LITHOTRIPSY    . NASAL SEPTUM SURGERY       reports that she has never smoked. She  has never used smokeless tobacco. She reports current alcohol use. She reports that she does not use drugs.  Allergies  Allergen Reactions  . Demerol [Meperidine]     Headaches  . Penicillins     Family History  Problem Relation Age of Onset  . Cancer Mother        Breast     Prior to Admission medications   Medication Sig Start Date End Date Taking? Authorizing Provider  AMABELZ 0.5-0.1 MG tablet  09/11/19   [provider]  buPROPion (WELLBUTRIN XL) 150 MG 24 hr tablet Take 1 tablet (150 mg total) by mouth daily. 12/26/20   Daphine Deutscher, Mary-Margaret, FNP  buPROPion (WELLBUTRIN XL) 300 MG 24 hr tablet Take 1 tablet (300 mg total) by mouth daily. 12/26/20   Daphine Deutscher, Mary-Margaret, FNP  fenofibrate (TRICOR) 145 MG tablet Take 1 tablet (145 mg total) by mouth daily. 12/26/20   Daphine Deutscher, Mary-Margaret, FNP  lisinopril (ZESTRIL) 20 MG tablet Take 1 tablet (20 mg total) by mouth daily. 12/26/20   Daphine Deutscher, Mary-Margaret, FNP  Vilazodone HCl (VIIBRYD) 20 MG TABS Take 1 tablet (20 mg total) by mouth daily. 12/26/20   Bennie Pierini, FNP    Physical Exam: Vitals:   05/17/21 1726 05/17/21 2000 05/17/21 2030 05/17/21 2100  BP: 100/69 112/76 106/73 104/62  Pulse: 95  96 88  Resp: 16 18 16 16   Temp: 98.4 F (36.9 C)  TempSrc: Oral     SpO2: 100% 100% 100% 100%      Constitutional: Anxious, no distress Vitals:   05/17/21 1726 05/17/21 2000 05/17/21 2030 05/17/21 2100  BP: 100/69 112/76 106/73 104/62  Pulse: 95  96 88  Resp: 16 18 16 16   Temp: 98.4 F (36.9 C)     TempSrc: Oral     SpO2: 100% 100% 100% 100%   Eyes: PERRL, lids and conjunctivae normal ENMT: Mucous membranes are moist. Posterior pharynx clear of any exudate or lesions.Normal dentition.  Neck: normal, supple, no masses, no thyromegaly Respiratory: clear to auscultation bilaterally, no wheezing, no crackles. Normal respiratory effort. No accessory muscle use.  Cardiovascular: Regular rate and rhythm, no  murmurs / rubs / gallops. No extremity edema. 2+ pedal pulses. No carotid bruits.  Abdomen: no tenderness, no masses palpated. No hepatosplenomegaly. Bowel sounds positive.  Musculoskeletal: no clubbing / cyanosis. No joint deformity upper and lower extremities. Good ROM, no contractures. Normal muscle tone.  Skin: no rashes, lesions, ulcers. No induration Neurologic: CN 2-12 grossly intact. Sensation intact, DTR normal. Strength 5/5 in all 4.  Psychiatric: Normal judgment and insight. Alert and oriented x 3.  Anxious mood.     Labs on Admission: I have personally reviewed following labs and imaging studies  CBC: Recent Labs  Lab 05/17/21 1531  WBC 9.8  NEUTROABS 6.6  HGB 11.0*  HCT 34.0*  MCV 93.9  PLT 327   Basic Metabolic Panel: Recent Labs  Lab 05/17/21 1531  NA 138  K 4.0  CL 108  CO2 23  GLUCOSE 101*  BUN 32*  CREATININE 0.84  CALCIUM 10.0   GFR: CrCl cannot be calculated (Unknown ideal weight.). Liver Function Tests: Recent Labs  Lab 05/17/21 1856  AST 24  ALT 35  ALKPHOS 56  BILITOT 1.1  PROT 6.7  ALBUMIN 4.4   No results for input(s): LIPASE, AMYLASE in the last 168 hours. No results for input(s): AMMONIA in the last 168 hours. Coagulation Profile: No results for input(s): INR, PROTIME in the last 168 hours. Cardiac Enzymes: No results for input(s): CKTOTAL, CKMB, CKMBINDEX, TROPONINI in the last 168 hours. BNP (last 3 results) No results for input(s): PROBNP in the last 8760 hours. HbA1C: No results for input(s): HGBA1C in the last 72 hours. CBG: No results for input(s): GLUCAP in the last 168 hours. Lipid Profile: No results for input(s): CHOL, HDL, LDLCALC, TRIG, CHOLHDL, LDLDIRECT in the last 72 hours. Thyroid Function Tests: No results for input(s): TSH, T4TOTAL, FREET4, T3FREE, THYROIDAB in the last 72 hours. Anemia Panel: No results for input(s): VITAMINB12, FOLATE, FERRITIN, TIBC, IRON, RETICCTPCT in the last 72 hours. Urine  analysis:    Component Value Date/Time   COLORURINE YELLOW 02/07/2016 0350   APPEARANCEUR Cloudy (A) 12/26/2020 1303   LABSPEC 1.015 02/07/2016 0350   PHURINE 6.5 02/07/2016 0350   GLUCOSEU Negative 12/26/2020 1303   HGBUR TRACE (A) 02/07/2016 0350   BILIRUBINUR Negative 12/26/2020 1303   KETONESUR 15 (A) 02/07/2016 0350   PROTEINUR Negative 12/26/2020 1303   PROTEINUR NEGATIVE 02/07/2016 0350   UROBILINOGEN negative 08/30/2014 1646   UROBILINOGEN 0.2 07/19/2009 1039   NITRITE Negative 12/26/2020 1303   NITRITE NEGATIVE 02/07/2016 0350   LEUKOCYTESUR Trace (A) 12/26/2020 1303   Sepsis Labs: @LABRCNTIP (procalcitonin:4,lacticidven:4) )No results found for this or any previous visit (from the past 240 hour(s)).   Radiological Exams on Admission: DG Chest 2 View  Result Date: 05/17/2021 CLINICAL DATA:  Short of breath  tachycardia EXAM: CHEST - 2 VIEW COMPARISON:  None. FINDINGS: The heart size and mediastinal contours are within normal limits. Both lungs are clear. The visualized skeletal structures are unremarkable. IMPRESSION: No active cardiopulmonary disease. Electronically Signed   By: Marlan Palau M.D.   On: 05/17/2021 15:57     Assessment/Plan Principal Problem:   GI bleed Active Problems:   GAD (generalized anxiety disorder)   Depression   Essential hypertension   Pure hypertriglyceridemia     #1 upper GI bleed: Most likely pain states related.  Patient will be admitted to a monitored bed.  Check serial CBCs.  IV Protonix, GI consulted for possible EGD.  Further treatment necessary.  #2 symptomatic anemia: Monitor H&H.  Continue treatment.  No transfusion at this point  #3 depression: Patient will be n.p.o. for now.  Once cleared by GI resume home regimen.  #4 hyperlipidemia: We will resume statin when cleared.  #5 anxiety disorder: We will resume her home regimen when cleared by GI.  #6 essential hypertension: Hold antihypertensives in the setting of GI  bleed.    DVT prophylaxis: SCD Code Status: Full code Family Communication: Sister at bedside Disposition Plan: Home Consults called: Gastroenterology from [facility] Admission status: Inpatient  Severity of Illness: The appropriate patient status for this patient is INPATIENT. Inpatient status is judged to be reasonable and necessary in order to provide the required intensity of service to ensure the patient's safety. The patient's presenting symptoms, physical exam findings, and initial radiographic and laboratory data in the context of their chronic comorbidities is felt to place them at high risk for further clinical deterioration. Furthermore, it is not anticipated that the patient will be medically stable for discharge from the hospital within 2 midnights of admission. The following factors support the patient status of inpatient.   " The patient's presenting symptoms include dizziness shortness of breath melena. " The worrisome physical exam findings include guaic positive stool and orthostasis. " The initial radiographic and laboratory data are worrisome because of hemoglobin 11: Occult blood positive. " The chronic co-morbidities include anxiety with depression.   * I certify that at the point of admission it is my clinical judgment that the patient will require inpatient hospital care spanning beyond 2 midnights from the point of admission due to high intensity of service, high risk for further deterioration and high frequency of surveillance required.Lonia Blood MD [facility] Pager [phone]  If 7PM-7AM, please contact night-coverage [email or web address] Password [facility]  05/17/2021, 10:12 PM

## 2021-05-17 NOTE — ED Provider Notes (Signed)
Emergency Medicine Provider Triage Evaluation Note  Kayla Vazquez , a 55 y.o. female  was evaluated in triage.  Pt complains of sob.  Review of Systems  Positive: Sob, mild chest discomfort Negative: Fever, cough, exertional chest pain  Physical Exam  There were no vitals taken for this visit. Gen:   Awake, no distress   Resp:  Normal effort  MSK:   Moves extremities without difficulty  Other:  tachycardic  Medical Decision Making  Medically screening exam initiated at 3:30 PM.  Appropriate orders placed.  THEREASA IANNELLO was informed that the remainder of the evaluation will be completed by another provider, this initial triage assessment does not replace that evaluation, and the importance of remaining in the ED until their evaluation is complete.  Acute onset of SOB this morning, seen at [facility] and sent here for further evaluation.  No hx of PE/DVT, was tachycardic on exam.  No significant cardiac hx.  Denies any significant covid sxs.    Fayrene Helper, PA-C 05/17/21 1531    Milagros Loll, MD 05/18/21 1010

## 2021-05-18 ENCOUNTER — Encounter (HOSPITAL_COMMUNITY)
Admission: EM | Disposition: A | Discharge status: Home / Self Care | Payer: Self-pay | Source: Home / Self Care | Attending: Emergency Medicine

## 2021-05-18 ENCOUNTER — Ambulatory Visit: Payer: BC Managed Care – PPO | Admitting: Nurse Practitioner

## 2021-05-18 ENCOUNTER — Inpatient Hospital Stay (HOSPITAL_COMMUNITY): Payer: BC Managed Care – PPO | Admitting: Certified Registered"

## 2021-05-18 ENCOUNTER — Encounter (HOSPITAL_COMMUNITY): Payer: Self-pay | Admitting: Internal Medicine

## 2021-05-18 DIAGNOSIS — D62 Acute posthemorrhagic anemia: Secondary | ICD-10-CM

## 2021-05-18 DIAGNOSIS — K250 Acute gastric ulcer with hemorrhage: Secondary | ICD-10-CM | POA: Diagnosis not present

## 2021-05-18 DIAGNOSIS — D500 Iron deficiency anemia secondary to blood loss (chronic): Secondary | ICD-10-CM | POA: Diagnosis not present

## 2021-05-18 DIAGNOSIS — I10 Essential (primary) hypertension: Secondary | ICD-10-CM

## 2021-05-18 DIAGNOSIS — F411 Generalized anxiety disorder: Secondary | ICD-10-CM

## 2021-05-18 DIAGNOSIS — K254 Chronic or unspecified gastric ulcer with hemorrhage: Principal | ICD-10-CM

## 2021-05-18 DIAGNOSIS — K921 Melena: Secondary | ICD-10-CM | POA: Diagnosis not present

## 2021-05-18 DIAGNOSIS — K922 Gastrointestinal hemorrhage, unspecified: Secondary | ICD-10-CM | POA: Diagnosis present

## 2021-05-18 HISTORY — PX: ESOPHAGOGASTRODUODENOSCOPY (EGD) WITH PROPOFOL: SHX5813

## 2021-05-18 HISTORY — PX: BIOPSY: SHX5522

## 2021-05-18 LAB — COMPREHENSIVE METABOLIC PANEL
ALT: 32 U/L (ref 0–44)
AST: 24 U/L (ref 15–41)
Albumin: 3.5 g/dL (ref 3.5–5.0)
Alkaline Phosphatase: 43 U/L (ref 38–126)
Anion gap: 6 (ref 5–15)
BUN: 19 mg/dL (ref 6–20)
CO2: 26 mmol/L (ref 22–32)
Calcium: 9.7 mg/dL (ref 8.9–10.3)
Chloride: 105 mmol/L (ref 98–111)
Creatinine, Ser: 0.83 mg/dL (ref 0.44–1.00)
GFR, Estimated: >60 mL/min (ref >60)
Glucose, Bld: 95 mg/dL (ref 70–99)
Potassium: 4.1 mmol/L (ref 3.5–5.1)
Sodium: 137 mmol/L (ref 135–145)
Total Bilirubin: 0.8 mg/dL (ref 0.3–1.2)
Total Protein: 5.4 g/dL — ABNORMAL LOW (ref 6.5–8.1)

## 2021-05-18 LAB — RETICULOCYTES
Immature Retic Fract: 18.5 % — ABNORMAL HIGH (ref 2.3–15.9)
RBC.: 3.34 MIL/uL — ABNORMAL LOW (ref 3.87–5.11)
Retic Count, Absolute: 102.2 10*3/uL (ref 19.0–186.0)
Retic Ct Pct: 3.1 % (ref 0.4–3.1)

## 2021-05-18 LAB — CBC
HCT: 30.5 % — ABNORMAL LOW (ref 36.0–46.0)
HCT: 31.3 % — ABNORMAL LOW (ref 36.0–46.0)
Hemoglobin: 10.4 g/dL — ABNORMAL LOW (ref 12.0–15.0)
Hemoglobin: 10.3 g/dL — ABNORMAL LOW (ref 12.0–15.0)
MCH: 30.7 pg (ref 26.0–34.0)
MCH: 30.8 pg (ref 26.0–34.0)
MCHC: 34.1 g/dL (ref 30.0–36.0)
MCHC: 32.9 g/dL (ref 30.0–36.0)
MCV: 90 fL (ref 80.0–100.0)
MCV: 93.7 fL (ref 80.0–100.0)
Platelets: 235 10*3/uL (ref 150–400)
Platelets: 268 10*3/uL (ref 150–400)
RBC: 3.39 MIL/uL — ABNORMAL LOW (ref 3.87–5.11)
RBC: 3.34 MIL/uL — ABNORMAL LOW (ref 3.87–5.11)
RDW: 13 % (ref 11.5–15.5)
RDW: 12.9 % (ref 11.5–15.5)
WBC: 8.1 10*3/uL (ref 4.0–10.5)
WBC: 5.8 10*3/uL (ref 4.0–10.5)
nRBC: 0 % (ref 0.0–0.2)
nRBC: 0 % (ref 0.0–0.2)

## 2021-05-18 LAB — IRON AND TIBC
Iron: 52 ug/dL (ref 28–170)
Saturation Ratios: 14 % (ref 10.4–31.8)
TIBC: 360 ug/dL (ref 250–450)
UIBC: 308 ug/dL

## 2021-05-18 LAB — FOLATE: Folate: 19.5 ng/mL (ref >5.9)

## 2021-05-18 LAB — VITAMIN B12: Vitamin B-12: 353 pg/mL (ref 180–914)

## 2021-05-18 LAB — FERRITIN: Ferritin: 57 ng/mL (ref 11–307)

## 2021-05-18 SURGERY — ESOPHAGOGASTRODUODENOSCOPY (EGD) WITH PROPOFOL
Anesthesia: Monitor Anesthesia Care

## 2021-05-18 MED ORDER — ONDANSETRON HCL 4 MG PO TABS: 4.0000 mg | ORAL_TABLET | Freq: Four times a day (QID) | ORAL | Status: DC | PRN

## 2021-05-18 MED ORDER — PANTOPRAZOLE SODIUM 40 MG IV SOLR: 40.0000 mg | Freq: Two times a day (BID) | INTRAVENOUS | Status: DC

## 2021-05-18 MED ORDER — SODIUM CHLORIDE 0.9 % IV SOLN
80.0000 mg | Freq: Once | INTRAVENOUS | Status: AC
  Administered 2021-05-18: 80 mg via INTRAVENOUS
  Filled 2021-05-18: qty 80

## 2021-05-18 MED ORDER — TRAMADOL HCL 50 MG PO TABS: 50.0000 mg | ORAL_TABLET | Freq: Two times a day (BID) | ORAL | 0 refills | Status: AC | PRN

## 2021-05-18 MED ORDER — PHENYLEPHRINE 40 MCG/ML (10ML) SYRINGE FOR IV PUSH (FOR BLOOD PRESSURE SUPPORT)
PREFILLED_SYRINGE | INTRAVENOUS | Status: DC | PRN
  Administered 2021-05-18: 80 ug via INTRAVENOUS

## 2021-05-18 MED ORDER — ONDANSETRON HCL 4 MG/2ML IJ SOLN: 4.0000 mg | Freq: Four times a day (QID) | INTRAMUSCULAR | Status: DC | PRN

## 2021-05-18 MED ORDER — PANTOPRAZOLE SODIUM 40 MG PO TBEC: 40.0000 mg | DELAYED_RELEASE_TABLET | Freq: Every day | ORAL | 3 refills | Status: DC

## 2021-05-18 MED ORDER — SODIUM CHLORIDE 0.9 % IV SOLN
8.0000 mg/h | INTRAVENOUS | Status: DC
  Administered 2021-05-18: 8 mg/h via INTRAVENOUS
  Filled 2021-05-18 (×2): qty 80

## 2021-05-18 MED ORDER — ACETAMINOPHEN 325 MG PO TABS
650.0000 mg | ORAL_TABLET | ORAL | Status: DC | PRN
  Administered 2021-05-18 (×2): 650 mg via ORAL
  Filled 2021-05-18 (×2): qty 2

## 2021-05-18 MED ORDER — PROPOFOL 500 MG/50ML IV EMUL
INTRAVENOUS | Status: DC | PRN
  Administered 2021-05-18: 175 ug/kg/min via INTRAVENOUS

## 2021-05-18 MED ORDER — LACTATED RINGERS IV SOLN: INTRAVENOUS | Status: DC

## 2021-05-18 SURGICAL SUPPLY — 14 items

## 2021-05-18 NOTE — Progress Notes (Signed)
[facility]                                                                              Patient Demographics  Kayla Vazquez, is a 55 y.o. female, DOB - 03-10-1966, YDX:[id number]  Admit date - 05/17/2021   Admitting Physician Rometta Emery, MD  Outpatient Primary MD for the patient is Bennie Pierini, FNP  Outpatient specialists:   LOS - 1  days   Medical records reviewed and are as summarized below:    Chief Complaint  Patient presents with  . Shortness of Breath  . Abnormal ECG       Brief summary   Patient is a 55 year old female with history of depression, NSAID use, anxiety disorder, hyperlipidemia presented to ED with dizziness, dark stools.  Patient reported that she takes off and on Goody powder or Aleve for headaches, took some a day before the admission.  On the day of admission at work, felt more short of breath, fatigue and tachycardia.  Patient went to urgent care and was sent over to the ER for evaluation.   Patient was found to have hemoglobin of 11.0 on admission, was 14.4 on 12/26/2020 FOBT positive.  Patient was admitted for symptomatic anemia and further work-up.  Assessment & Plan    Principal Problem:    Upper GI bleed, acute symptomatic anemia -Continue Protonix drip, continue serial H&H, add anemia panel -Currently does not need transfusion. -GI consulted, plan for EGD  Active Problems:   GAD (generalized anxiety disorder), depression -Resume Wellbutrin XL after endoscopy completed     Essential hypertension -BP currently borderline, will hold off on lisinopril    Pure hypertriglyceridemia -Resume fenofibrate after endoscopy once taking p.o.  Obesity Estimated body mass index is 30.54 kg/m as calculated from the following:   Height as of 12/26/20: 5\' 2"  (1.575 m).   Weight as of 12/26/20: 75.8 kg.  Code Status: Full CODE STATUS DVT Prophylaxis:  SCDs Start: 05/18/21 0037   Level of Care: Level of care: Telemetry  Medical Family Communication: Discussed all imaging results, lab results, explained to the patient    Disposition Plan:     Status is: Inpatient  Remains inpatient appropriate because:Inpatient level of care appropriate due to severity of illness   Dispo: The patient is from: Home              Anticipated d/c is to: Home              Patient currently is not medically stable to d/c.  Needs endoscopy, possibly home tomorrow  Difficult to place patient No      Time Spent in minutes   35 minutes  Procedures:  None  Consultants:   Gastroenterology  Antimicrobials:   Anti-infectives (From admission, onward)   None          Medications  Scheduled Meds: . [MAR Hold] pantoprazole  40 mg Intravenous Q12H   Continuous Infusions: . lactated ringers 100 mL/hr at 05/18/21 0059   PRN Meds:.[MAR Hold] acetaminophen, [MAR Hold] ondansetron **OR** [MAR Hold] ondansetron (ZOFRAN) IV      Subjective:   07/18/21  was seen and examined today.  No acute abdominal pain, no nausea vomiting or diarrhea.  Currently no chest pain or dizziness.  Dark BM yesterday.  Objective:   Vitals:   05/17/21 2230 05/18/21 0035 05/18/21 0536 05/18/21 0750  BP: 102/61 100/70 (!) 104/59 (!) 101/58  Pulse: 86 95 83 83  Resp: (!) 22 18 18 17   Temp:  98.5 F (36.9 C) 98.2 F (36.8 C) 98.7 F (37.1 C)  TempSrc:  Oral Oral Oral  SpO2: 98% 100% 96% 98%   No intake or output data in the 24 hours ending 05/18/21 1037   Wt Readings from Last 3 Encounters:  12/26/20 75.8 kg  05/17/20 78.5 kg  11/17/19 80.3 kg     Exam  General: Alert and oriented x 3, NAD  Cardiovascular: S1 S2 auscultated, no murmurs, RRR  Respiratory: Clear to auscultation bilaterally, no wheezing  Gastrointestinal: Soft, nontender, nondistended, + bowel sounds  Ext: no pedal edema bilaterally  Neuro: no new deficits  Musculoskeletal: No digital cyanosis, clubbing  Skin: No rashes  Psych: Normal affect  and demeanor, alert and oriented x3    Data Reviewed:  I have personally reviewed following labs and imaging studies  Micro Results Recent Results (from the past 240 hour(s))  Resp Panel by RT-PCR (Flu A&B, Covid) Nasopharyngeal Swab     Status: None   Collection Time: 05/17/21  9:18 PM   Specimen: Nasopharyngeal Swab; Nasopharyngeal(NP) swabs in vial transport medium  Result Value Ref Range Status   SARS Coronavirus 2 by RT PCR NEGATIVE NEGATIVE Final    Comment: (NOTE) SARS-CoV-2 target nucleic acids are NOT DETECTED.  The SARS-CoV-2 RNA is generally detectable in upper respiratory specimens during the acute phase of infection. The lowest concentration of SARS-CoV-2 viral copies this assay can detect is 138 copies/mL. A negative result does not preclude SARS-Cov-2 infection and should not be used as the sole basis for treatment or other patient management decisions. A negative result may occur with  improper specimen collection/handling, submission of specimen other than nasopharyngeal swab, presence of viral mutation(s) within the areas targeted by this assay, and inadequate number of viral copies(<138 copies/mL). A negative result must be combined with clinical observations, patient history, and epidemiological information. The expected result is Negative.  Fact Sheet for Patients:  07/17/21  Fact Sheet for Healthcare Providers:  BloggerCourse.com  This test is no t yet approved or cleared by the [employer] FDA and  has been authorized for detection and/or diagnosis of SARS-CoV-2 by FDA under an Emergency Use Authorization (EUA). This EUA will remain  in effect (meaning this test can be used) for the duration of the COVID-19 declaration under Section 564(b)(1) of the Act, 21 U.S.C.section 360bbb-3(b)(1), unless the authorization is terminated  or revoked sooner.       Influenza A by PCR NEGATIVE NEGATIVE Final    Influenza B by PCR NEGATIVE NEGATIVE Final    Comment: (NOTE) The Xpert Xpress SARS-CoV-2/FLU/RSV plus assay is intended as an aid in the diagnosis of influenza from Nasopharyngeal swab specimens and should not be used as a sole basis for treatment. Nasal washings and aspirates are unacceptable for Xpert Xpress SARS-CoV-2/FLU/RSV testing.  Fact Sheet for Patients: [place]  Fact Sheet for Healthcare Providers: BloggerCourse.com  This test is not yet approved or cleared by the [employer]ious[employer] FDA and has been authorized for detection and/or diagnosis of SARS-CoV-2 by FDA under an Emergency Use Authorization (EUA). This EUA will remain in effect (meaning this  test can be used) for the duration of the COVID-19 declaration under Section 564(b)(1) of the Act, 21 U.S.C. section 360bbb-3(b)(1), unless the authorization is terminated or revoked.  Performed at [facility], [address], [place], [place] [address]     Radiology Reports DG Chest 2 View  Result Date: 05/17/2021 CLINICAL DATA:  Short of breath tachycardia EXAM: CHEST - 2 VIEW COMPARISON:  None. FINDINGS: The heart size and mediastinal contours are within normal limits. Both lungs are clear. The visualized skeletal structures are unremarkable. IMPRESSION: No active cardiopulmonary disease. Electronically Signed   By: Marlan Palau M.D.   On: 05/17/2021 15:57    Lab Data:  CBC: Recent Labs  Lab 05/17/21 1531 05/18/21 0336  WBC 9.8 8.1  NEUTROABS 6.6  --   HGB 11.0* 10.4*  HCT 34.0* 30.5*  MCV 93.9 90.0  PLT 327 235   Basic Metabolic Panel: Recent Labs  Lab 05/17/21 1531 05/18/21 0653  NA 138 137  K 4.0 4.1  CL 108 105  CO2 23 26  GLUCOSE 101* 95  BUN 32* 19  CREATININE 0.84 0.83  CALCIUM 10.0 9.7   GFR: CrCl cannot be calculated (Unknown ideal weight.). Liver Function Tests: Recent Labs  Lab 05/17/21 1856 05/18/21 0653  AST 24 24   ALT 35 32  ALKPHOS 56 43  BILITOT 1.1 0.8  PROT 6.7 5.4*  ALBUMIN 4.4 3.5   No results for input(s): LIPASE, AMYLASE in the last 168 hours. No results for input(s): AMMONIA in the last 168 hours. Coagulation Profile: No results for input(s): INR, PROTIME in the last 168 hours. Cardiac Enzymes: No results for input(s): CKTOTAL, CKMB, CKMBINDEX, TROPONINI in the last 168 hours. BNP (last 3 results) No results for input(s): PROBNP in the last 8760 hours. HbA1C: No results for input(s): HGBA1C in the last 72 hours. CBG: No results for input(s): GLUCAP in the last 168 hours. Lipid Profile: No results for input(s): CHOL, HDL, LDLCALC, TRIG, CHOLHDL, LDLDIRECT in the last 72 hours. Thyroid Function Tests: No results for input(s): TSH, T4TOTAL, FREET4, T3FREE, THYROIDAB in the last 72 hours. Anemia Panel: No results for input(s): VITAMINB12, FOLATE, FERRITIN, TIBC, IRON, RETICCTPCT in the last 72 hours. Urine analysis:    Component Value Date/Time   COLORURINE YELLOW 02/07/2016 0350   APPEARANCEUR Cloudy (A) 12/26/2020 1303   LABSPEC 1.015 02/07/2016 0350   PHURINE 6.5 02/07/2016 0350   GLUCOSEU Negative 12/26/2020 1303   HGBUR TRACE (A) 02/07/2016 0350   BILIRUBINUR Negative 12/26/2020 1303   KETONESUR 15 (A) 02/07/2016 0350   PROTEINUR Negative 12/26/2020 1303   PROTEINUR NEGATIVE 02/07/2016 0350   UROBILINOGEN negative 08/30/2014 1646   UROBILINOGEN 0.2 07/19/2009 1039   NITRITE Negative 12/26/2020 1303   NITRITE NEGATIVE 02/07/2016 0350   LEUKOCYTESUR Trace (A) 12/26/2020 1303     Bernardo Brayman M.D. [facility] 05/18/2021, 10:37 AM  Available via Epic secure chat 7am-7pm After 7 pm, please refer to night coverage provider listed on amion.

## 2021-05-18 NOTE — Progress Notes (Signed)
Received patient from ED awake,alert/orientedx4 and able to verbalize needs. VSS; NAD noted; respirations easy/even on room air. Movement/sensation to all extremities noted. Tele monitor placed per orders. Oriented patient to room and floor at this time. Whiteboard updated. All safety measures in place and personal belongings within reach.

## 2021-05-18 NOTE — Anesthesia Postprocedure Evaluation (Signed)
Anesthesia Post Note  Patient: Kayla Vazquez  Procedure(s) Performed: ESOPHAGOGASTRODUODENOSCOPY (EGD) WITH PROPOFOL (N/A ) BIOPSY     Patient location during evaluation: Endoscopy Anesthesia Type: MAC Level of consciousness: awake and alert Pain management: pain level controlled Vital Signs Assessment: post-procedure vital signs reviewed and stable Respiratory status: spontaneous breathing, nonlabored ventilation and respiratory function stable Cardiovascular status: blood pressure returned to baseline and stable Postop Assessment: no apparent nausea or vomiting Anesthetic complications: no   No complications documented.  Last Vitals:  Vitals:   05/18/21 1230 05/18/21 1235  BP: 106/64   Pulse: 81 83  Resp: 19 17  Temp:    SpO2: 100% 98%    Last Pain:  Vitals:   05/18/21 1235  TempSrc:   PainSc: 0-No pain                 Lidia Collum

## 2021-05-18 NOTE — Discharge Summary (Signed)
Physician Discharge Summary   Patient ID: Kayla Vazquez MRN: [id number] DOB/AGE: 06/24/1966 55 y.o.  Admit date: 05/17/2021 Discharge date: 05/18/2021  Primary Care Physician:  Bennie Pierini, FNP   Recommendations for Outpatient Follow-up:  1. Follow up with PCP in 1-2 weeks 2. Started on Protonix 40 mg daily, outpatient follow-up with GI in 4 to 6 weeks 3. Avoid NSAIDs, Goody powder.    Home Health: At baseline Equipment/Devices:   Discharge Condition: stable  CODE STATUS: FULL  Diet recommendation:    Discharge Diagnoses:    . Upper GI bleed secondary to gastric ulcers, esophagitis Acute symptomatic anemia . Essential hypertension . GAD (generalized anxiety disorder) . Pure hypertriglyceridemia . Depression  migraine headaches Obesity    Consults: Gastroenterology    Allergies:   Allergies  Allergen Reactions  . Penicillins Other (See Comments)    Unknown Reaction  . Demerol [Meperidine] Other (See Comments)    Headaches     DISCHARGE MEDICATIONS: Allergies as of 05/18/2021      Reactions   Penicillins Other (See Comments)   Unknown Reaction   Demerol [meperidine] Other (See Comments)   Headaches      Medication List    TAKE these medications   buPROPion 300 MG 24 hr tablet Commonly known as: Wellbutrin XL Take 1 tablet (300 mg total) by mouth daily.   fenofibrate 145 MG tablet Commonly known as: TRICOR Take 1 tablet (145 mg total) by mouth daily.   lisinopril 20 MG tablet Commonly known as: ZESTRIL Take 1 tablet (20 mg total) by mouth daily.   pantoprazole 40 MG tablet Commonly known as: Protonix Take 1 tablet (40 mg total) by mouth daily before breakfast.   traMADol 50 MG tablet Commonly known as: Ultram Take 1 tablet (50 mg total) by mouth every 12 (twelve) hours as needed for moderate pain or severe pain.        Brief H and P: For complete details please refer to admission H and P, but in brief Patient is a 55 year old  female with history of depression, NSAID use, anxiety disorder, hyperlipidemia presented to ED with dizziness, dark stools.  Patient reported that she takes off and on Goody powder or Aleve for headaches, took some a day before the admission.  On the day of admission at work, felt more short of breath, fatigue and tachycardia.  Patient went to urgent care and was sent over to the ER for evaluation.   Patient was found to have hemoglobin of 11.0 on admission, was 14.4 on 12/26/2020 FOBT positive.  Patient was admitted for symptomatic anemia and further work-up.  Hospital Course:  Upper GI bleed, acute symptomatic anemia -Continue Protonix drip, continue serial H&H, add anemia panel -Currently does not need transfusion. -GI was consulted, underwent EGD which showed nonbleeding small gastric ulcers without high risk stigmata, reflux esophagitis otherwise unremarkable. -Recommended avoid NSAIDs and Goody powders, pantoprazole 40 mg daily     GAD (generalized anxiety disorder), depression -Continue Wellbutrin XL     Essential hypertension -Resume lisinopril upon discharge.    Pure hypertriglyceridemia -Resume fenofibrate   Obesity Estimated body mass index is 30.54 kg/m as calculated from the following:   Height as of 12/26/20: 5\' 2"  (1.575 m).   Weight as of 12/26/20: 75.8 kg.  Day of Discharge S: Doing well, endoscopy completed.  Cleared by GI to discharge home.  BP 106/64   Pulse 83   Temp 97.7 F (36.5 C) (Oral)   Resp 17  Ht 5\' 2"  (1.575 m)   Wt 71.7 kg   SpO2 98%   BMI 28.90 kg/m   Physical Exam: General: Alert and awake oriented x3 not in any acute distress. HEENT: anicteric sclera, pupils reactive to light and accommodation CVS: S1-S2 clear no murmur rubs or gallops Chest: clear to auscultation bilaterally, no wheezing rales or rhonchi Abdomen: soft nontender, nondistended, normal bowel sounds Extremities: no cyanosis, clubbing or edema noted bilaterally Neuro:  Cranial nerves II-XII intact, no focal neurological deficits    Get Medicines reviewed and adjusted: Please take all your medications with you for your next visit with your Primary MD  Please request your Primary MD to go over all hospital tests and procedure/radiological results at the follow up. Please ask your Primary MD to get all Hospital records sent to his/her office.  If you experience worsening of your admission symptoms, develop shortness of breath, life threatening emergency, suicidal or homicidal thoughts you must seek medical attention immediately by calling 911 or calling your MD immediately  if symptoms less severe.  You must read complete instructions/literature along with all the possible adverse reactions/side effects for all the Medicines you take and that have been prescribed to you. Take any new Medicines after you have completely understood and accept all the possible adverse reactions/side effects.   Do not drive when taking pain medications.   Do not take more than prescribed Pain, Sleep and Anxiety Medications  Special Instructions: If you have smoked or chewed Tobacco  in the last 2 yrs please stop smoking, stop any regular Alcohol  and or any Recreational drug use.  Wear Seat belts while driving.  Please note  You were cared for by a hospitalist during your hospital stay. Once you are discharged, your primary care physician will handle any further medical issues. Please note that NO REFILLS for any discharge medications will be authorized once you are discharged, as it is imperative that you return to your primary care physician (or establish a relationship with a primary care physician if you do not have one) for your aftercare needs so that they can reassess your need for medications and monitor your lab values.   The results of significant diagnostics from this hospitalization (including imaging, microbiology, ancillary and laboratory) are listed below for  reference.      Procedures/Studies:  DG Chest 2 View  Result Date: 05/17/2021 CLINICAL DATA:  Short of breath tachycardia EXAM: CHEST - 2 VIEW COMPARISON:  None. FINDINGS: The heart size and mediastinal contours are within normal limits. Both lungs are clear. The visualized skeletal structures are unremarkable. IMPRESSION: No active cardiopulmonary disease. Electronically Signed   By: 07/17/2021 M.D.   On: 05/17/2021 15:57       LAB RESULTS: Basic Metabolic Panel: Recent Labs  Lab 05/17/21 1531 05/18/21 0653  NA 138 137  K 4.0 4.1  CL 108 105  CO2 23 26  GLUCOSE 101* 95  BUN 32* 19  CREATININE 0.84 0.83  CALCIUM 10.0 9.7   Liver Function Tests: Recent Labs  Lab 05/17/21 1856 05/18/21 0653  AST 24 24  ALT 35 32  ALKPHOS 56 43  BILITOT 1.1 0.8  PROT 6.7 5.4*  ALBUMIN 4.4 3.5   No results for input(s): LIPASE, AMYLASE in the last 168 hours. No results for input(s): AMMONIA in the last 168 hours. CBC: Recent Labs  Lab 05/17/21 1531 05/18/21 0336 05/18/21 1305  WBC 9.8 8.1 5.8  NEUTROABS 6.6  --   --  HGB 11.0* 10.4* 10.3*  HCT 34.0* 30.5* 31.3*  MCV 93.9 90.0 93.7  PLT 327 235 268   Cardiac Enzymes: No results for input(s): CKTOTAL, CKMB, CKMBINDEX, TROPONINI in the last 168 hours. BNP: Invalid input(s): POCBNP CBG: No results for input(s): GLUCAP in the last 168 hours.     Disposition and Follow-up: Discharge Instructions    Increase activity slowly   Complete by: As directed        DISPOSITION: Home   DISCHARGE FOLLOW-UP  Follow-up Information    Bennie Pierini, FNP. Schedule an appointment as soon as possible for a visit in 2 week(s).   Specialty: Family Medicine Contact information: [address] [place] [address] [phone]        Hilarie Fredrickson, MD. Schedule an appointment as soon as possible for a visit in 4 week(s).   Specialty: Gastroenterology Why: Office will call you with appointment Contact  information: [address] [place] [address] [phone]                Time coordinating discharge:  35 minutes  Signed:   Thad Ranger M.D. [facility] 05/18/2021, 3:26 PM

## 2021-05-18 NOTE — Progress Notes (Signed)
Patient dcd with daughter Jon Gills. Patient instructed not to drive today since she received propofol this morning. Both patient and daughter verbalized understanding.

## 2021-05-18 NOTE — Transfer of Care (Signed)
Immediate Anesthesia Transfer of Care Note  Patient: Kayla Vazquez  Procedure(s) Performed: ESOPHAGOGASTRODUODENOSCOPY (EGD) WITH PROPOFOL (N/A ) BIOPSY  Patient Location: Endoscopy Unit  Anesthesia Type:MAC  Level of Consciousness: awake, drowsy and patient cooperative  Airway & Oxygen Therapy: Patient Spontanous Breathing and Patient connected to nasal cannula oxygen  Post-op Assessment: Report given to RN, Post -op Vital signs reviewed and stable and Patient moving all extremities X 4  Post vital signs: Reviewed and stable  Last Vitals:  Vitals Value Taken Time  BP    Temp    Pulse 89 05/18/21 1159  Resp 18 05/18/21 1159  SpO2 98 % 05/18/21 1159  Vitals shown include unvalidated device data.  Last Pain:  Vitals:   05/18/21 1044  TempSrc: Oral  PainSc: 0-No pain         Complications: No complications documented.

## 2021-05-18 NOTE — Op Note (Signed)
[facility] Patient Name: Kayla Vazquez Procedure Date : 05/18/2021 MRN: [id number] Attending MD: Wilhemina Bonito. Marina Goodell , MD Date of Birth: Oct 31, 1966 CSN: [id number] Age: 55 Admit Type: Inpatient Procedure:                Upper GI endoscopy with biopsies Indications:              Acute post hemorrhagic anemia, Melena Providers:                Wilhemina Bonito. Marina Goodell, MD, Dayton Bailiff, RN, Leanne Lovely, Technician, Bettey Mare. Print production planner, CRNA Referring MD:             [facility] Medicines:                Monitored Anesthesia Care Complications:            No immediate complications. Estimated Blood Loss:     Estimated blood loss: none. Procedure:                Pre-Anesthesia Assessment:                           - Prior to the procedure, a History and Physical                            was performed, and patient medications and                            allergies were reviewed. The patient's tolerance of                            previous anesthesia was also reviewed. The risks                            and benefits of the procedure and the sedation                            options and risks were discussed with the patient.                            All questions were answered, and informed consent                            was obtained. Prior Anticoagulants: The patient has                            taken no previous anticoagulant or antiplatelet                            agents. After reviewing the risks and benefits, the                            patient was deemed in satisfactory condition to  undergo the procedure.                           After obtaining informed consent, the endoscope was                            passed under direct vision. Throughout the                            procedure, the patient's blood pressure, pulse, and                            oxygen saturations were monitored  continuously. The                            GIF-H190 ([id number] Olympus gastroscope was                            introduced through the mouth, and advanced to the                            second part of duodenum. The upper GI endoscopy was                            accomplished without difficulty. The patient                            tolerated the procedure well. Scope In: Scope Out: Findings:      The esophagus revealed mild esophagitis. No Barrett's.      Several non-bleeding cratered gastric ulcers with no stigmata of       bleeding was found in the prepyloric region of the stomach. The largest       lesion was 5 mm in largest dimension. Biopsies were taken with a cold       forceps for Helicobacter pylori testing using CLOtest.      The stomach was otherwise normal.      The examined duodenum was normal.      The cardia and gastric fundus were normal on retroflexion, save hiatal       hernia. Impression:               1. Nonbleeding small gastric ulcers without high                            risk stigmata.                           2. Reflux esophagitis                           3. Otherwise unremarkable EGD                           4. Status post CLO biopsy Recommendation:           1. Pantoprazole 40 mg daily. Please prescribe with  2 refills                           2. Avoid NSAIDs and Goody powders.                           3. Okay for discharge home today                           4. GI will arrange for follow-up with me in about 4                            weeks. Dr. Lamar Sprinkles office number is [phone]                           I have discussed this with the patient. I have                            provided her a copy of this report Procedure Code(s):        --- Professional ---                           [phone], Esophagogastroduodenoscopy, flexible,                            transoral; with biopsy, single or multiple Diagnosis  Code(s):        --- Professional ---                           K25.9, Gastric ulcer, unspecified as acute or                            chronic, without hemorrhage or perforation                           D62, Acute posthemorrhagic anemia                           K92.1, Melena (includes Hematochezia) CPT copyright 2019 American Medical Association. All rights reserved. The codes documented in this report are preliminary and upon coder review may  be revised to meet current compliance requirements. Wilhemina Bonito. Marina Goodell, MD 05/18/2021 12:12:09 PM This report has been signed electronically. Number of Addenda: 0

## 2021-05-18 NOTE — Consult Note (Addendum)
[facility] Consult: 8:23 AM 05/18/2021  LOS: 1 day    Referring Provider: Dr Isidoro Donning  Primary Care Physician:  Bennie Pierini, FNP in [facility] Primary Gastroenterologist:  Gentry Fitz.       Reason for Consultation: FOBT positive anemia.   HPI: Kayla Vazquez is a 55 y.o. female.  PMH Htn.  Hld.  Anxiety.  Mild hepatic steatosis, tiny HH, uterine fibroid per 08/2016 CT.  Breast reduction 2002.   In May 2020 she had a microcytosis and Hb of 9.7.  Was prescribed iron which she took until January 2022.  At that time recheck Hb was 14.4, MCV was 90 and iron was discontinued.  Patient reports that at the time of anemia diagnosis she had ice pica and periodic dysphagia both of which resolved once the anemia corrected.  Cologuard testing in 2021 was negative.  2 days ago, on a Tuesday she experienced a headache and some lightheadedness in the morning which improved and she went to work around noon.  That same day she also had a dark stool.  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  She made an appointment with her PCP but was advised to go to the urgent care center.  At urgent care she had tachycardia on EKG so they sent her to the ED. The dark stool on Tuesday, has not had any bowel movement.  Denies nausea, vomiting, abdominal pain, anorexia.  She used 1 Goody's powder on Tuesday to address the headache and uses 1 or 2 of these weekly at the most.  Patient has lost 10 or 15 pounds with effort by going to the gym and careful food selections.  High-sensitivity troponins were normal.  Hgb 11 >> 10.4, was 14.4 in Jan 2022.  MCV, WBCs, platelets normal.   BUN 32 >> 19.  Creat normal.   FOBT +     Family history negative for peptic ulcer disease, GI bleeds, anemia, colorectal cancer.      Past Medical History:  Diagnosis Date  . Anxiety   . Hyperlipidemia   . Hypertension     Past Surgical History:  Procedure Laterality Date  . BREAST REDUCTION SURGERY    . ENDOMETRIAL ABLATION    . LITHOTRIPSY    . NASAL SEPTUM SURGERY      Prior to Admission medications   Medication Sig Start Date End Date Taking? Authorizing Provider  buPROPion (WELLBUTRIN XL) 300 MG 24 hr tablet Take 1 tablet (300 mg total) by mouth daily. 12/26/20  Yes Martin, Mary-Margaret, FNP  fenofibrate (TRICOR) 145 MG tablet Take 1 tablet (145 mg total) by mouth daily. 12/26/20  Yes Martin, Mary-Margaret, FNP  lisinopril (ZESTRIL) 20 MG tablet Take 1 tablet (20 mg total) by mouth daily. 12/26/20  Yes Daphine Deutscher, Mary-Margaret, FNP    Scheduled Meds: . [START ON 05/21/2021] pantoprazole  40 mg Intravenous Q12H   Infusions: . lactated ringers 100 mL/hr at 05/18/21  0059  . pantoprozole (PROTONIX) infusion 8 mg/hr (05/18/21 0717)   PRN Meds: acetaminophen, ondansetron **OR** ondansetron (ZOFRAN) IV   Allergies as of 05/17/2021 - Review Complete 05/17/2021  Allergen Reaction Noted  . Penicillins Other (See Comments) 07/29/2014  . Demerol [meperidine] Other (See Comments) 07/29/2014    Family History  Problem Relation Age of Onset  . Cancer Mother        Breast    Social History   Socioeconomic History  . Marital status: Married    Spouse name: Not on file  . Number of children: Not on file  . Years of education: Not on file  . Highest education level: Not on file  Occupational History  . Not on file  Tobacco Use  . Smoking status: Never Smoker  . Smokeless tobacco: Never Used  Vaping Use  . Vaping Use: Never used  Substance and Sexual Activity  . Alcohol use: Yes    Comment: rare  . Drug use: No  . Sexual activity: Never  Other Topics Concern  . Not on file  Social History Narrative  .  Not on file   Social Determinants of Health   Financial Resource Strain: Not on file  Food Insecurity: Not on file  Transportation Needs: Not on file  Physical Activity: Not on file  Stress: Not on file  Social Connections: Not on file  Intimate Partner Violence: Not on file    REVIEW OF SYSTEMS: Constitutional: Weakness, no profound fatigue. ENT:  No nose bleeds Pulm: No cough.  Dyspnea on exertion as per HPI. CV:  No palpitations, no LE edema.  No angina. GU:  No hematuria, no frequency GI: See HPI. Heme: Denies unusual or excessive bleeding or bruising. Transfusions: None in the past or currently. Neuro:  No peripheral tingling or numbness.  No syncope, no seizures.  Positive dizziness as per HPI. Derm:  No itching, no rash or sores.  Endocrine:  No sweats or chills.  No polyuria or dysuria Immunization: Not queried. Travel:  None beyond local counties in last few months.    PHYSICAL EXAM: Vital signs in last 24 hours: Vitals:   05/18/21 0536 05/18/21 0750  BP: (!) 104/59 (!) 101/58  Pulse: 83 83  Resp: 18 17  Temp: 98.2 F (36.8 C) 98.7 F (37.1 C)  SpO2: 96% 98%   Wt Readings from Last 3 Encounters:  12/26/20 75.8 kg  05/17/20 78.5 kg  11/17/19 80.3 kg    General: Pleasant, well-appearing, comfortable. Head: No facial asymmetry, no signs of head trauma. Eyes: No scleral icterus or conjunctival pallor. Ears: Not hard of hearing Nose: No congestion or discharge Mouth: Oropharynx moist, pink, clear.  Tongue midline.  Good dentition. Neck: No JVD, no masses, no thyromegaly. Lungs: Good breath sounds.  No shortness of breath, no cough Heart: RRR.  No MRG.  S1, S2 present. Abdomen: Soft without tenderness.  No HSM, masses, bruits, hernias..   Rectal: Deferred.  Stool submitted to the lab tests FOBT positive. Musc/Skeltl: No joint redness, swelling or gross deformity. Extremities: No CCE. Neurologic: Fully alert and oriented.  Good historian.  Moves all 4  limbs.  No tremors, no asterixis. Skin: No rash, no sores, no telangiectasia. Nodes: No cervical adenopathy. Psych: Pleasant, calm, cooperative.  Fluid speech.  Intake/Output from previous day: No intake/output data recorded. Intake/Output this shift: No intake/output data recorded.  LAB RESULTS: Recent Labs    05/17/21 1531 05/18/21 0336  WBC 9.8 8.1  HGB 11.0* 10.4*  HCT 34.0* 30.5*  PLT 327 235   BMET Lab Results  Component Value Date   NA 137 05/18/2021   NA 138 05/17/2021   NA 141 12/26/2020   K 4.1 05/18/2021   K 4.0 05/17/2021   K 4.2 12/26/2020   CL 105 05/18/2021   CL 108 05/17/2021   CL 104 12/26/2020   CO2 26 05/18/2021   CO2 23 05/17/2021   CO2 24 12/26/2020   GLUCOSE 95 05/18/2021   GLUCOSE 101 (H) 05/17/2021   GLUCOSE 95 12/26/2020   BUN 19 05/18/2021   BUN 32 (H) 05/17/2021   BUN 15 12/26/2020   CREATININE 0.83 05/18/2021   CREATININE 0.84 05/17/2021   CREATININE 1.01 (H) 12/26/2020   CALCIUM 9.7 05/18/2021   CALCIUM 10.0 05/17/2021   CALCIUM 11.4 (H) 12/26/2020   LFT Recent Labs    05/17/21 1856 05/18/21 0653  PROT 6.7 5.4*  ALBUMIN 4.4 3.5  AST 24 24  ALT 35 32  ALKPHOS 56 43  BILITOT 1.1 0.8  BILIDIR 0.1  --   IBILI 1.0*  --    PT/INR No results found for: INR, PROTIME Hepatitis Panel No results for input(s): HEPBSAG, HCVAB, HEPAIGM, HEPBIGM in the last 72 hours. C-Diff No components found for: CDIFF Lipase  No results found for: LIPASE  Drugs of Abuse  No results found for: LABOPIA, COCAINSCRNUR, LABBENZ, AMPHETMU, THCU, LABBARB   RADIOLOGY STUDIES: DG Chest 2 View  Result Date: 05/17/2021 CLINICAL DATA:  Short of breath tachycardia EXAM: CHEST - 2 VIEW COMPARISON:  None. FINDINGS: The heart size and mediastinal contours are within normal limits. Both lungs are clear. The visualized skeletal structures are unremarkable. IMPRESSION: No active cardiopulmonary disease. Electronically Signed   By: Marlan Palau M.D.   On:  05/17/2021 15:57     IMPRESSION:   *    Dark, FOBT positive stools with 3 g drop Hgb over 6 months. Anemia symptomatic with acute DOE, tachycardia, dizziness. Treated with several months of iron for IDA starting in May 2021 through January 2022, Hgb responded nicely to iron. No prior colonoscopy or EGD but negative Cologuard screening 2021.    PLAN:     *   EGD,? Timing.  *   Switch to Protonix from drip to 40 mg IV bid.     Jennye Moccasin  05/18/2021, 8:23 AM Phone [phone]  GI ATTENDING  History, laboratories, x-rays reviewed.  Patient personally seen and examined.  Agree with comprehensive consultation note as outlined above.  This patient has a background of iron deficiency anemia for which she had been given iron therapy previously.  Negative Cologuard testing in 2021.  Has had recent reports of dark stools and presyncopal symptoms after taking Goody powders recently.  Suspect upper GI bleed due to NSAID related ulcer.  Agree with PPI.  No need for transfusion given hemoglobin.  She does feel better after IV fluids.  Plan urgent upper endoscopy today.  The nature of the procedure, as well as the risks, benefits, and alternatives were carefully and thoroughly reviewed with the patient. Ample time for discussion and questions allowed. The patient understood, was satisfied, and agreed to proceed.Thereafter will need iron replacement and outpatient GI follow-up to schedule routine colonoscopy.  I have discussed this with her.  She understands and agrees.  Wilhemina Bonito. Eda Keys., M.D. [facility] Division of Gastroenterology

## 2021-05-18 NOTE — Anesthesia Preprocedure Evaluation (Signed)
Anesthesia Evaluation  Patient identified by MRN, date of birth, ID band Patient awake    Reviewed: Allergy & Precautions, NPO status , Patient's Chart, lab work & pertinent test results  History of Anesthesia Complications Negative for: history of anesthetic complications  Airway Mallampati: II  TM Distance: >3 FB Neck ROM: Full    Dental  (+) Teeth Intact   Pulmonary neg pulmonary ROS,    Pulmonary exam normal        Cardiovascular hypertension, Normal cardiovascular exam     Neuro/Psych Anxiety Depression negative neurological ROS     GI/Hepatic Neg liver ROS, Suspected UGIB   Endo/Other  negative endocrine ROS  Renal/GU negative Renal ROS  negative genitourinary   Musculoskeletal negative musculoskeletal ROS (+)   Abdominal   Peds  Hematology  (+) anemia ,   Anesthesia Other Findings   Reproductive/Obstetrics                             Anesthesia Physical Anesthesia Plan  ASA: II  Anesthesia Plan: MAC   Post-op Pain Management:    Induction: Intravenous  PONV Risk Score and Plan: 2 and Propofol infusion, TIVA and Treatment may vary due to age or medical condition  Airway Management Planned: Natural Airway, Nasal Cannula and Simple Face Mask  Additional Equipment: None  Intra-op Plan:   Post-operative Plan:   Informed Consent: I have reviewed the patients History and Physical, chart, labs and discussed the procedure including the risks, benefits and alternatives for the proposed anesthesia with the patient or authorized representative who has indicated his/her understanding and acceptance.       Plan Discussed with:   Anesthesia Plan Comments:         Anesthesia Quick Evaluation

## 2021-05-19 ENCOUNTER — Telehealth: Payer: Self-pay

## 2021-05-19 ENCOUNTER — Telehealth: Payer: Self-pay | Admitting: Nurse Practitioner

## 2021-05-19 LAB — CLOTEST (H. PYLORI), BIOPSY: Helicobacter screen: NEGATIVE — AB

## 2021-05-19 NOTE — Telephone Encounter (Signed)
Transition Care Management Unsuccessful Follow-up Telephone Call  Date of discharge and from where:  [facility]  Diagnosis: upper GI bleed, anemia   Attempts:  1st Attempt  Reason for unsuccessful TCM follow-up call:  Left voice message

## 2021-05-22 ENCOUNTER — Encounter (HOSPITAL_COMMUNITY): Payer: Self-pay | Admitting: Internal Medicine

## 2021-05-22 NOTE — Telephone Encounter (Signed)
Contacted patient, appointment scheduled.

## 2021-05-23 NOTE — Telephone Encounter (Signed)
Transition Care Management Follow-up Telephone Call  Date of discharge and from where: 05/18/21 [facility]  Diagnosis: Upper GI bleed secondary to gastric ulcers, anemia  How have you been since you were released from the hospital? Much better  Any questions or concerns? No  Items Reviewed:  Did the pt receive and understand the discharge instructions provided? Yes   Medications obtained and verified? Yes   Other? No   Any new allergies since your discharge? No   Dietary orders reviewed? Yes  Do you have support at home? Yes   Home Care and Equipment/Supplies: Were home health services ordered? no  Were any new equipment or medical supplies ordered?  No  Functional Questionnaire: (I = Independent and D = Dependent) ADLs: I  Bathing/Dressing- I  Meal Prep- I  Eating- I  Maintaining continence- I  Transferring/Ambulation- I  Managing Meds- I  Follow up appointments reviewed:   PCP Hospital f/u appt confirmed? Yes  Scheduled to see Gennette Pac on 05/25/21 @ 9.  Specialist Hospital f/u appt confirmed? No    Are transportation arrangements needed? No   If their condition worsens, is the pt aware to call PCP or go to the Emergency Dept.? Yes  Was the patient provided with contact information for the PCP's office or ED? Yes  Was to pt encouraged to call back with questions or concerns? Yes

## 2021-05-24 ENCOUNTER — Telehealth: Payer: Self-pay

## 2021-05-24 NOTE — Telephone Encounter (Signed)
-----   Message from Dianah Field, PA-C sent at 05/23/2021  5:01 PM EDT ----- Regarding: RE: arrange follow up w MD or APP. Found her: Kayla Vazquez DOB [birth year].  Thank the Lord I found her.    ----- Message ----- From: Chrystie Nose, RN Sent: 05/23/2021   3:20 PM EDT To: Dianah Field, PA-C Subject: RE: arrange follow up w MD or APP.             Ok thanks ----- Message ----- From: Jonah Blue Sent: 05/23/2021   2:57 PM EDT To: Chrystie Nose, RN Subject: RE: arrange follow up w MD or APP.             I am sorry about that.  I will have to look through the roster and get back to you. ----- Message ----- From: Chrystie Nose, RN Sent: 05/23/2021  10:41 AM EDT To: Dianah Field, PA-C Subject: FW: arrange follow up w MD or APP.             Maralyn Sago,  You did not send me the pt name for this appt. Please help.  Thanks, Bonita Quin ----- Message ----- From: Jonah Blue Sent: 05/18/2021  12:33 PM EDT To: Hilarie Fredrickson, MD, Chrystie Nose, RN Subject: arrange follow up w MD or APP.                 Hi. Dr Marina Goodell would like pt seen in office in 4 to 6 weeks.  If APP appt, any NP or PA will do as seen by myself during admission.  Fup reason is anemia, GERD, gastric ulcers w GI bleed.   Thanks,  Maralyn Sago

## 2021-05-24 NOTE — Telephone Encounter (Signed)
Pt scheduled to see Dr. Marina Goodell 07/04/21@9 :40am, pt aware of appt.

## 2021-05-25 ENCOUNTER — Other Ambulatory Visit: Payer: Self-pay

## 2021-05-25 ENCOUNTER — Ambulatory Visit: Payer: BC Managed Care – PPO | Admitting: Nurse Practitioner

## 2021-05-25 ENCOUNTER — Encounter: Payer: Self-pay | Admitting: Nurse Practitioner

## 2021-05-25 VITALS — BP 143/89 | HR 94 | Temp 97.5°F | Resp 20 | Ht 62.0 in | Wt 158.0 lb

## 2021-05-25 DIAGNOSIS — R519 Headache, unspecified: Secondary | ICD-10-CM

## 2021-05-25 DIAGNOSIS — Z09 Encounter for follow-up examination after completed treatment for conditions other than malignant neoplasm: Secondary | ICD-10-CM

## 2021-05-25 DIAGNOSIS — K254 Chronic or unspecified gastric ulcer with hemorrhage: Secondary | ICD-10-CM

## 2021-05-25 DIAGNOSIS — G8929 Other chronic pain: Secondary | ICD-10-CM | POA: Diagnosis not present

## 2021-05-25 LAB — HEMOGLOBIN, FINGERSTICK: Hemoglobin: 10.9 g/dL — ABNORMAL LOW (ref 11.1–15.9)

## 2021-05-25 MED ORDER — PROPRANOLOL HCL ER 60 MG PO CP24: 60.0000 mg | ORAL_CAPSULE | Freq: Every day | ORAL | 3 refills | Status: DC

## 2021-05-25 NOTE — Patient Instructions (Signed)
General Headache Without Cause A headache is pain or discomfort that is felt around the head or neck area. There are many causes and types of headaches. In some cases, the cause may not be found. Follow these instructions at home: Watch your condition for any changes. Let your doctor know about them. Take these steps to help with your condition: Managing pain  Take over-the-counter and prescription medicines only as told by your doctor.  Lie down in a dark, quiet room when you have a headache.  If told, put ice on your head and neck area: ? Put ice in a plastic bag. ? Place a towel between your skin and the bag. ? Leave the ice on for 20 minutes, 2-3 times per day.  If told, put heat on the affected area. Use the heat source that your doctor recommends, such as a moist heat pack or a heating pad. ? Place a towel between your skin and the heat source. ? Leave the heat on for 20-30 minutes. ? Remove the heat if your skin turns bright red. This is very important if you are unable to feel pain, heat, or cold. You may have a greater risk of getting burned.  Keep lights dim if bright lights bother you or make your headaches worse.      Eating and drinking  Eat meals on a regular schedule.  If you drink alcohol: ? Limit how much you use to:  0-1 drink a day for women.  0-2 drinks a day for men. ? Be aware of how much alcohol is in your drink. In the [place] one drink equals one 12 oz bottle of beer (355 mL), one 5 oz glass of wine (148 mL), or one 1 oz glass of hard liquor (44 mL).  Stop drinking caffeine, or reduce how much caffeine you drink. General instructions  Keep a journal to find out if certain things bring on headaches. For example, write down: ? What you eat and drink. ? How much sleep you get. ? Any change to your diet or medicines.  Get a massage or try other ways to relax.  Limit stress.  Sit up straight. Do not tighten (tense) your muscles.  Do not use any  products that contain nicotine or tobacco. This includes cigarettes, e-cigarettes, and chewing tobacco. If you need help quitting, ask your doctor.  Exercise regularly as told by your doctor.  Get enough sleep. This often means 7-9 hours of sleep each night.  Keep all follow-up visits as told by your doctor. This is important.   Contact a doctor if:  Your symptoms are not helped by medicine.  You have a headache that feels different than the other headaches.  You feel sick to your stomach (nauseous) or you throw up (vomit).  You have a fever. Get help right away if:  Your headache gets very bad quickly.  Your headache gets worse after a lot of physical activity.  You keep throwing up.  You have a stiff neck.  You have trouble seeing.  You have trouble speaking.  You have pain in the eye or ear.  Your muscles are weak or you lose muscle control.  You lose your balance or have trouble walking.  You feel like you will pass out (faint) or you pass out.  You are mixed up (confused).  You have a seizure. Summary  A headache is pain or discomfort that is felt around the head or neck area.  There are many   causes and types of headaches. In some cases, the cause may not be found.  Keep a journal to help find out what causes your headaches. Watch your condition for any changes. Let your doctor know about them.  Contact a doctor if you have a headache that is different from usual, or if your headache is not helped by medicine.  Get help right away if your headache gets very bad, you throw up, you have trouble seeing, you lose your balance, or you have a seizure. This information is not intended to replace advice given to you by your health care provider. Make sure you discuss any questions you have with your health care provider. Document Revised: 06/23/2018 Document Reviewed: 06/23/2018 Elsevier Patient Education  2021 [employer]vier Inc.  

## 2021-05-25 NOTE — Progress Notes (Signed)
Subjective:    Patient ID: Kayla Vazquez, female    DOB: 12-Sep-1966, 55 y.o.   MRN: [id number]   Chief Complaint: Hospital follow up  Today's visit was for Transitional Care Management.  The patient was discharged from [facility] on 05/18/21 with a primary diagnosis of GI bleed secondary to gastric ulcers.   Contact with the patient and/or caregiver, by a clinical staff member, was made on 05/18/21 and was documented as a telephone encounter within the EMR.  Through chart review and discussion with the patient I have determined that management of their condition is of moderate complexity.   Patient was admitted to hospital with anemia and was discovered to have a gi bleed secondary to gastric ulcers. She was sent home on protonix and was told to avoid all NSAIDS and goody powders. She was told to follow up with GI in 4-6 weeks.  Her only complaint today is headaches. Has one at least every other day. Pain starts in back of head and radiates to front mainly on right side.      Review of Systems  Constitutional:  Negative for diaphoresis.  Eyes:  Negative for pain.  Respiratory:  Negative for shortness of breath.   Cardiovascular:  Negative for chest pain, palpitations and leg swelling.  Gastrointestinal:  Negative for abdominal pain.  Endocrine: Negative for polydipsia.  Skin:  Negative for rash.  Neurological:  Negative for dizziness, weakness and headaches.  Hematological:  Does not bruise/bleed easily.  All other systems reviewed and are negative.     Objective:   Physical Exam Vitals reviewed.  Constitutional:      Appearance: Normal appearance.  Cardiovascular:     Rate and Rhythm: Normal rate and regular rhythm.     Heart sounds: Normal heart sounds.  Pulmonary:     Effort: Pulmonary effort is normal.     Breath sounds: Normal breath sounds.  Abdominal:     General: Abdomen is flat. Bowel sounds are normal.     Palpations: Abdomen is soft.  Skin:    General: Skin  is warm.  Neurological:     General: No focal deficit present.     Mental Status: She is alert.  Psychiatric:        Mood and Affect: Mood normal.        Behavior: Behavior normal.    BP (!) 143/89   Pulse 94   Temp (!) 97.5 F (36.4 C) (Temporal)   Resp 20   Ht 5\' 2"  (1.575 m)   Wt 158 lb (71.7 kg)   SpO2 98%   BMI 28.90 kg/m        Assessment & Plan:   Kayla Vazquez in today with chief complaint of Transitions Of Care   1. Chronic gastric ulcer with hemorrhage Continue to avoid nsaids and NO GOODY POWDERS - Hemoglobin, fingerstick  2. Hospital discharge follow-up Hospital records reviewed  3. Chronic nonintractable headache, unspecified headache type Will try propranlol for headache prevention - propranolol ER (INDERAL LA) 60 MG 24 hr capsule; Take 1 capsule (60 mg total) by mouth daily.  Dispense: 30 capsule; Refill: 3    The above assessment and management plan was discussed with the patient. The patient verbalized understanding of and has agreed to the management plan. Patient is aware to call the clinic if symptoms persist or worsen. Patient is aware when to return to the clinic for a follow-up visit. Patient educated on when it is appropriate to go  to the emergency department.   Mary-Margaret Hassell Done, FNP

## 2021-07-04 ENCOUNTER — Encounter: Payer: Self-pay | Admitting: Internal Medicine

## 2021-07-04 ENCOUNTER — Ambulatory Visit (INDEPENDENT_AMBULATORY_CARE_PROVIDER_SITE_OTHER): Payer: BC Managed Care – PPO | Admitting: Internal Medicine

## 2021-07-04 ENCOUNTER — Other Ambulatory Visit (INDEPENDENT_AMBULATORY_CARE_PROVIDER_SITE_OTHER): Payer: BC Managed Care – PPO

## 2021-07-04 VITALS — BP 124/72 | HR 97 | Ht 62.0 in | Wt 160.0 lb

## 2021-07-04 DIAGNOSIS — Z1211 Encounter for screening for malignant neoplasm of colon: Secondary | ICD-10-CM

## 2021-07-04 DIAGNOSIS — K259 Gastric ulcer, unspecified as acute or chronic, without hemorrhage or perforation: Secondary | ICD-10-CM | POA: Diagnosis not present

## 2021-07-04 DIAGNOSIS — D62 Acute posthemorrhagic anemia: Secondary | ICD-10-CM | POA: Diagnosis not present

## 2021-07-04 LAB — CBC WITH DIFFERENTIAL/PLATELET
Basophils Absolute: 0.1 10*3/uL (ref 0.0–0.1)
Basophils Relative: 0.9 % (ref 0.0–3.0)
Eosinophils Absolute: 0.1 10*3/uL (ref 0.0–0.7)
Eosinophils Relative: 1.9 % (ref 0.0–5.0)
HCT: 38.7 % (ref 36.0–46.0)
Hemoglobin: 13.1 g/dL (ref 12.0–15.0)
Lymphocytes Relative: 19.9 % (ref 12.0–46.0)
Lymphs Abs: 1.4 10*3/uL (ref 0.7–4.0)
MCHC: 33.8 g/dL (ref 30.0–36.0)
MCV: 88.2 fl (ref 78.0–100.0)
Monocytes Absolute: 0.5 10*3/uL (ref 0.1–1.0)
Monocytes Relative: 7.2 % (ref 3.0–12.0)
Neutro Abs: 4.8 10*3/uL (ref 1.4–7.7)
Neutrophils Relative %: 70.1 % (ref 43.0–77.0)
Platelets: 322 10*3/uL (ref 150.0–400.0)
RBC: 4.39 Mil/uL (ref 3.87–5.11)
RDW: 14 % (ref 11.5–15.5)
WBC: 6.8 10*3/uL (ref 4.0–10.5)

## 2021-07-04 MED ORDER — SUTAB 1479-225-188 MG PO TABS: 1.0000 | ORAL_TABLET | Freq: Once | ORAL | 0 refills | Status: AC

## 2021-07-04 MED ORDER — PANTOPRAZOLE SODIUM 40 MG PO TBEC: 40.0000 mg | DELAYED_RELEASE_TABLET | Freq: Every day | ORAL | 3 refills | Status: AC

## 2021-07-04 NOTE — Patient Instructions (Signed)
If you are age 56 or older, your body mass index should be between 23-30. Your Body mass index is 29.26 kg/m. If this is out of the aforementioned range listed, please consider follow up with your Primary Care Provider.  If you are age 23 or younger, your body mass index should be between 19-25. Your Body mass index is 29.26 kg/m. If this is out of the aformentioned range listed, please consider follow up with your Primary Care Provider.   __________________________________________________________  The [facility] providers would like to encourage you to use [facility] to communicate with providers for non-urgent requests or questions.  Due to long hold times on the telephone, sending your provider a message by [facility] may be a faster and more efficient way to get a response.  Please allow 48 business hours for a response.  Please remember that this is for non-urgent requests.    Your provider has requested that you go to the basement level for lab work before leaving today. Press "B" on the elevator. The lab is located at the first door on the left as you exit the elevator.  We have sent the following medications to your pharmacy for you to pick up at your convenience:  Pantoprazole  You have been scheduled for an endoscopy and colonoscopy. Please follow the written instructions given to you at your visit today. Please pick up your prep supplies at the pharmacy within the next 1-3 days. If you use inhalers (even only as needed), please bring them with you on the day of your procedure.

## 2021-07-05 ENCOUNTER — Encounter: Payer: Self-pay | Admitting: Internal Medicine

## 2021-07-05 NOTE — Progress Notes (Signed)
HISTORY OF PRESENT ILLNESS:  Kayla Vazquez is a 55 y.o. female, new to this practice, who was seen unassigned at the hospital for acute upper GI bleeding for which she underwent upper endoscopy May 18, 2021.  She was found to have mild distal esophagitis and multiple nonbleeding gastric ulcers without stigmata.  She had been using NSAIDs (headache powders) regularly.  Biopsies for Helicobacter pylori testing returned negative.  She was placed on once daily PPI in the form of pantoprazole.  She continues on this medication.  Her discharge hemoglobin was 10.3.  Follow-up hemoglobin 1 week later was 10.9.  She was placed on iron.  She was having significant fatigue, but this has improved.  She has not had colon cancer screening.  GI review of systems at this time is entirely negative.  She does tell me that she was placed on beta-blocker and has had less issues with her headaches  REVIEW OF SYSTEMS:  All non-GI ROS negative unless otherwise stated in the HPI except for headaches, fatigue  Past Medical History:  Diagnosis Date   Anxiety    Hyperlipidemia    Hypertension     Past Surgical History:  Procedure Laterality Date   BIOPSY  05/18/2021   Procedure: BIOPSY;  Surgeon: Hilarie Fredrickson, MD;  Location[facility] ENDOSCOPY;  Service: Endoscopy;;   BREAST REDUCTION SURGERY     ENDOMETRIAL ABLATION     ESOPHAGOGASTRODUODENOSCOPY (EGD) WITH PROPOFOL N/A 05/18/2021   Procedure: ESOPHAGOGASTRODUODENOSCOPY (EGD) WITH PROPOFOL;  Surgeon: Hilarie Fredrickson, MD;  Location: [facility] ENDOSCOPY;  Service: Endoscopy;  Laterality: N/A;   LITHOTRIPSY     NASAL SEPTUM SURGERY     UPPER GASTROINTESTINAL ENDOSCOPY      Social History [name]  reports that she has never smoked. She has never used smokeless tobacco. She reports current alcohol use. She reports that she does not use drugs.  family history includes Breast cancer in her mother; Cancer in her mother; Diabetes in her maternal grandmother.  Allergies   Allergen Reactions   Penicillins Other (See Comments)    Unknown Reaction   Demerol [Meperidine] Other (See Comments)    Headaches       PHYSICAL EXAMINATION: Vital signs: BP 124/72 (BP Location: Left Arm, Patient Position: Sitting, Cuff Size: Normal)   Pulse 97   Ht 5\' 2"  (1.575 m)   Wt 160 lb (72.6 kg)   SpO2 98%   BMI 29.26 kg/m   Constitutional: generally well-appearing, no acute distress Psychiatric: alert and oriented x3, cooperative Eyes: extraocular movements intact, anicteric, conjunctiva pink Mouth: oral pharynx moist, no lesions Neck: supple no lymphadenopathy Cardiovascular: heart regular rate and rhythm, no murmur Lungs: clear to auscultation bilaterally Abdomen: soft, nontender, nondistended, no obvious ascites, no peritoneal signs, normal bowel sounds, no organomegaly Rectal: Deferred until colonoscopy Extremities: no clubbing, cyanosis, or lower extremity edema bilaterally Skin: no lesions on visible extremities Neuro: No focal deficits. No asterixis.     ASSESSMENT:  1.  Acute GI bleeding secondary to NSAID induced gastric ulcers.  No recurrent bleeding on PPI.  No longer using NSAIDs. 2.  Acute blood loss anemia.  Feeling better on iron.  Last hemoglobin 10.9 3.  Colon cancer screening.  Baseline risk.  The patient is interested in pursuing optical colonoscopy   PLAN:  1.  Continue pantoprazole 40 mg daily.  Prescription refilled.  Medication risks reviewed 2.  CBC today 3.  Continue to avoid NSAIDs 4.  Continue iron therapy for the time  being 5.  Schedule upper endoscopy to assess for ulcer healing..The nature of the procedure, as well as the risks, benefits, and alternatives were carefully and thoroughly reviewed with the patient. Ample time for discussion and questions allowed. The patient understood, was satisfied, and agreed to proceed.  6.  Schedule colonoscopy for colorectal neoplasia screening.The nature of the procedure, as well as the risks,  benefits, and alternatives were carefully and thoroughly reviewed with the patient. Ample time for discussion and questions allowed. The patient understood, was satisfied, and agreed to proceed.   ADDENDUM: Hemoglobin has returned normal at 13.1.  Patient notified

## 2021-07-26 ENCOUNTER — Other Ambulatory Visit: Payer: Self-pay | Admitting: Nurse Practitioner

## 2021-07-26 DIAGNOSIS — I10 Essential (primary) hypertension: Secondary | ICD-10-CM

## 2021-07-26 DIAGNOSIS — E781 Pure hyperglyceridemia: Secondary | ICD-10-CM

## 2021-07-27 NOTE — Telephone Encounter (Signed)
MMM NTBS 6 mos ckup was to be July mail order not sent

## 2021-09-28 ENCOUNTER — Telehealth: Payer: Self-pay | Admitting: Internal Medicine

## 2021-09-28 NOTE — Telephone Encounter (Signed)
Hi Dr. Marina Goodell, this patient just called to cancel procedure that was scheduled for tomorrow because she lost her insurance coverage.Patient will call back to reschedule. Thank you.

## 2021-09-29 ENCOUNTER — Encounter: Payer: BC Managed Care – PPO | Admitting: Internal Medicine

## 2022-08-07 ENCOUNTER — Encounter: Payer: Self-pay | Admitting: Nurse Practitioner

## 2022-08-07 ENCOUNTER — Ambulatory Visit: Payer: 59 | Admitting: Nurse Practitioner

## 2022-08-07 VITALS — BP 142/78 | HR 84 | Temp 97.4°F | Resp 20 | Ht 62.0 in | Wt 164.0 lb

## 2022-08-07 DIAGNOSIS — F411 Generalized anxiety disorder: Secondary | ICD-10-CM | POA: Diagnosis not present

## 2022-08-07 DIAGNOSIS — I10 Essential (primary) hypertension: Secondary | ICD-10-CM | POA: Diagnosis not present

## 2022-08-07 DIAGNOSIS — R519 Headache, unspecified: Secondary | ICD-10-CM

## 2022-08-07 DIAGNOSIS — Z8719 Personal history of other diseases of the digestive system: Secondary | ICD-10-CM

## 2022-08-07 DIAGNOSIS — G8929 Other chronic pain: Secondary | ICD-10-CM

## 2022-08-07 DIAGNOSIS — F323 Major depressive disorder, single episode, severe with psychotic features: Secondary | ICD-10-CM

## 2022-08-07 DIAGNOSIS — E781 Pure hyperglyceridemia: Secondary | ICD-10-CM | POA: Diagnosis not present

## 2022-08-07 DIAGNOSIS — Z6830 Body mass index (BMI) 30.0-30.9, adult: Secondary | ICD-10-CM

## 2022-08-07 MED ORDER — FENOFIBRATE 145 MG PO TABS: 145.0000 mg | ORAL_TABLET | Freq: Every day | ORAL | 1 refills | Status: DC

## 2022-08-07 MED ORDER — ESCITALOPRAM OXALATE 10 MG PO TABS: 10.0000 mg | ORAL_TABLET | Freq: Every day | ORAL | 1 refills | Status: AC

## 2022-08-07 MED ORDER — PROPRANOLOL HCL ER 60 MG PO CP24: 60.0000 mg | ORAL_CAPSULE | Freq: Every day | ORAL | 1 refills | Status: DC

## 2022-08-07 MED ORDER — LISINOPRIL 20 MG PO TABS: 20.0000 mg | ORAL_TABLET | Freq: Every day | ORAL | 1 refills | Status: DC

## 2022-08-07 MED ORDER — BUPROPION HCL ER (XL) 300 MG PO TB24: 300.0000 mg | ORAL_TABLET | Freq: Every day | ORAL | 1 refills | Status: DC

## 2022-08-07 NOTE — Progress Notes (Signed)
Subjective:    Patient ID: Kayla Vazquez, female    DOB: Apr 27, 1966, 56 y.o.   MRN: [id number]   Chief Complaint: medical management of chronic issues     HPI:  Kayla Vazquez is a 56 y.o. who identifies as a female who was assigned female at birth.   Social history: Lives with: her daughter Work history: [employer]   Comes in today for follow up of the following chronic medical issues:  1. Essential hypertension No c/o chest pain, sob or headache. Does not check blood pressure at home. Has been out of blood pressure meds for over a month BP Readings from Last 3 Encounters:  07/04/21 124/72  05/25/21 (!) 143/89  05/18/21 106/60     2. Pure hypertriglyceridemia Does try to watch diet but does little to no dedicated exercise. Lab Results  Component Value Date   CHOL 161 12/26/2020   HDL 42 12/26/2020   LDLCALC 97 12/26/2020   TRIG 125 12/26/2020   CHOLHDL 3.8 12/26/2020     3. Current severe episode of major depressive disorder with psychotic features, unspecified whether recurrent ([place]) Is currently on wellbutrin and depression seems to have worsened.    08/07/2022    8:14 AM 05/25/2021    9:13 AM 12/26/2020   12:43 PM  Depression screen PHQ 2/9  Decreased Interest 2 0 1  Down, Depressed, Hopeless 2 0 1  PHQ - 2 Score 4 0 2  Altered sleeping _0 Tired, decreased energy 3 0 1  Change in appetite 2 0 1  Feeling bad or failure about yourself  2 0 1  Trouble concentrating _1 Moving slowly or fidgety/restless 1 0 0  Suicidal thoughts 0 0 0  PHQ-9 Score _2 Difficult doing work/chores Not difficult at all Not difficult at all Not difficult at all      4. GAD (generalized anxiety disorder) Welllbutin helps    08/07/2022    8:14 AM 05/25/2021    9:13 AM 12/26/2020   12:58 PM 05/17/2020   11:45 AM  GAD 7 : Generalized Anxiety Score  Nervous, Anxious, on Edge _3 Control/stop worrying _4 Worry too much - different things _5 Trouble relaxing 2 3 0 3  Restless _6 0  Easily annoyed or irritable 2 3 0 3  Afraid - awful might happen 2 0 0 0  Total GAD 7 Score _7 Anxiety Difficulty Not difficult at all Not difficult at all Somewhat difficult Not difficult at all      5. BMI 30.0-30.9,adult No recent weight changes   New complaints: Recently had GI bleed and had to see GI. They dd endoscopy. She is now on protonix daily  Allergies  Allergen Reactions   Penicillins Other (See Comments)    Unknown Reaction   Demerol [Meperidine] Other (See Comments)    Headaches   Outpatient Encounter Medications as of 08/07/2022  Medication Sig   buPROPion (WELLBUTRIN XL) 300 MG 24 hr tablet Take 1 tablet (300 mg total) by mouth daily.   fenofibrate (TRICOR) 145 MG tablet Take 1 tablet (145 mg total) by mouth daily.   lisinopril (ZESTRIL) 20 MG tablet Take 1 tablet (20 mg total) by mouth daily.   pantoprazole (PROTONIX) 40 MG tablet Take 1 tablet (40 mg total) by mouth daily before breakfast.   propranolol ER (INDERAL  LA) 60 MG 24 hr capsule Take 1 capsule (60 mg total) by mouth daily.   No facility-administered encounter medications on file as of 08/07/2022.    Past Surgical History:  Procedure Laterality Date   BIOPSY  05/18/2021   Procedure: BIOPSY;  Surgeon: Irene Shipper, MD;  Location: [facility] ENDOSCOPY;  Service: Endoscopy;;   BREAST REDUCTION SURGERY     ENDOMETRIAL ABLATION     ESOPHAGOGASTRODUODENOSCOPY (EGD) WITH PROPOFOL N/A 05/18/2021   Procedure: ESOPHAGOGASTRODUODENOSCOPY (EGD) WITH PROPOFOL;  Surgeon: Irene Shipper, MD;  Location: [facility] ENDOSCOPY;  Service: Endoscopy;  Laterality: N/A;   LITHOTRIPSY     NASAL SEPTUM SURGERY     UPPER GASTROINTESTINAL ENDOSCOPY      Family History  Problem Relation Age of Onset   Breast cancer Mother    Cancer Mother        Breast   Diabetes Maternal Grandmother    Colon cancer Neg Hx    Esophageal cancer Neg Hx    Pancreatic cancer Neg Hx    Stomach  cancer Neg Hx       Controlled substance contract: n/a     Review of Systems  Constitutional:  Negative for diaphoresis.  Eyes:  Negative for pain.  Respiratory:  Negative for shortness of breath.   Cardiovascular:  Negative for chest pain, palpitations and leg swelling.  Gastrointestinal:  Negative for abdominal pain.  Endocrine: Negative for polydipsia.  Skin:  Negative for rash.  Neurological:  Negative for dizziness, weakness and headaches.  Hematological:  Does not bruise/bleed easily.  All other systems reviewed and are negative.      Objective:   Physical Exam Vitals and nursing note reviewed.  Constitutional:      General: She is not in acute distress.    Appearance: Normal appearance. She is well-developed.  HENT:     Head: Normocephalic.     Right Ear: Tympanic membrane normal.     Left Ear: Tympanic membrane normal.     Nose: Nose normal.     Mouth/Throat:     Mouth: Mucous membranes are moist.  Eyes:     Pupils: Pupils are equal, round, and reactive to light.  Neck:     Vascular: No carotid bruit or JVD.  Cardiovascular:     Rate and Rhythm: Normal rate and regular rhythm.     Heart sounds: Normal heart sounds.  Pulmonary:     Effort: Pulmonary effort is normal. No respiratory distress.     Breath sounds: Normal breath sounds. No wheezing or rales.  Chest:     Chest wall: No tenderness.  Abdominal:     General: Bowel sounds are normal. There is no distension or abdominal bruit.     Palpations: Abdomen is soft. There is no hepatomegaly, splenomegaly, mass or pulsatile mass.     Tenderness: There is no abdominal tenderness.  Musculoskeletal:        General: Normal range of motion.     Cervical back: Normal range of motion and neck supple.  Lymphadenopathy:     Cervical: No cervical adenopathy.  Skin:    General: Skin is warm and dry.  Neurological:     Mental Status: She is alert and oriented to person, place, and time.     Deep Tendon Reflexes:  Reflexes are normal and symmetric.  Psychiatric:        Behavior: Behavior normal.        Thought Content: Thought content normal.  Judgment: Judgment normal.    BP (!) 142/78   Pulse 84   Temp (!) 97.4 F (36.3 C) (Temporal)   Resp 20   Ht _0  (1.575 m)   Wt 164 lb (74.4 kg)   SpO2 97%   BMI 30.00 kg/m          Assessment & Plan:  DIEM DICOCCO comes in today with chief complaint of Medical Management of Chronic Issues   Diagnosis and orders addressed:  1. Essential hypertension Low sodium d iet - lisinopril (ZESTRIL) 20 MG tablet; Take 1 tablet (20 mg total) by mouth daily.  Dispense: 30 tablet; Refill: 1 - CBC with Differential/Platelet - CMP14+EGFR  2. Pure hypertriglyceridemia Low fat diet - fenofibrate (TRICOR) 145 MG tablet; Take 1 tablet (145 mg total) by mouth daily.  Dispense: 30 tablet; Refill: 1 - Lipid panel  3. Current severe episode of major depressive disorder with psychotic features, unspecified whether recurrent ([place]) Added lexapro Stress management - buPROPion (WELLBUTRIN XL) 300 MG 24 hr tablet; Take 1 tablet (300 mg total) by mouth daily.  Dispense: 30 tablet; Refill: 1 - escitalopram (LEXAPRO) 10 MG tablet; Take 1 tablet (10 mg total) by mouth daily.  Dispense: 30 tablet; Refill: 1  4. GAD (generalized anxiety disorder) Stress management  5. BMI 30.0-30.9,adult Discussed diet and exercise for person with BMI >25 Will recheck weight in 3-6 months  6. History of GI bleed Avoid spicy and fatty foods  7. Chronic nonintractable headache, unspecified headache type Avoid caffeine - propranolol ER (INDERAL LA) 60 MG 24 hr capsule; Take 1 capsule (60 mg total) by mouth daily.  Dispense: 30 capsule; Refill: 1   Labs pending Health Maintenance reviewed Diet and exercise encouraged  Follow up plan: 6 months   Mary-Margaret Hassell Done, FNP

## 2022-08-07 NOTE — Patient Instructions (Signed)

## 2022-08-08 LAB — CBC WITH DIFFERENTIAL/PLATELET
Basophils Absolute: 0.1 10*3/uL (ref 0.0–0.2)
Basos: 1 %
EOS (ABSOLUTE): 0.2 10*3/uL (ref 0.0–0.4)
Eos: 3 %
Hematocrit: 42.7 % (ref 34.0–46.6)
Hemoglobin: 14.5 g/dL (ref 11.1–15.9)
Immature Grans (Abs): 0 10*3/uL (ref 0.0–0.1)
Immature Granulocytes: 0 %
Lymphocytes Absolute: 1.6 10*3/uL (ref 0.7–3.1)
Lymphs: 23 %
MCH: 30.8 pg (ref 26.6–33.0)
MCHC: 34 g/dL (ref 31.5–35.7)
MCV: 91 fL (ref 79–97)
Monocytes Absolute: 0.5 10*3/uL (ref 0.1–0.9)
Monocytes: 8 %
Neutrophils Absolute: 4.5 10*3/uL (ref 1.4–7.0)
Neutrophils: 65 %
Platelets: 295 10*3/uL (ref 150–450)
RBC: 4.71 x10E6/uL (ref 3.77–5.28)
RDW: 12.4 % (ref 11.7–15.4)
WBC: 6.9 10*3/uL (ref 3.4–10.8)

## 2022-08-08 LAB — CMP14+EGFR
ALT: 24 IU/L (ref 0–32)
AST: 17 IU/L (ref 0–40)
Albumin/Globulin Ratio: 2.4 — ABNORMAL HIGH (ref 1.2–2.2)
Albumin: 4.8 g/dL (ref 3.8–4.9)
Alkaline Phosphatase: 92 IU/L (ref 44–121)
BUN/Creatinine Ratio: 15 (ref 9–23)
BUN: 14 mg/dL (ref 6–24)
Bilirubin Total: 0.7 mg/dL (ref 0.0–1.2)
CO2: 23 mmol/L (ref 20–29)
Calcium: 10.4 mg/dL — ABNORMAL HIGH (ref 8.7–10.2)
Chloride: 104 mmol/L (ref 96–106)
Creatinine, Ser: 0.91 mg/dL (ref 0.57–1.00)
Globulin, Total: 2 g/dL (ref 1.5–4.5)
Glucose: 110 mg/dL — ABNORMAL HIGH (ref 70–99)
Potassium: 4.9 mmol/L (ref 3.5–5.2)
Sodium: 140 mmol/L (ref 134–144)
Total Protein: 6.8 g/dL (ref 6.0–8.5)
eGFR: 74 mL/min/{1.73_m2} (ref >59)

## 2022-08-08 LAB — LIPID PANEL
Chol/HDL Ratio: 4.7 ratio — ABNORMAL HIGH (ref 0.0–4.4)
Cholesterol, Total: 206 mg/dL — ABNORMAL HIGH (ref 100–199)
HDL: 44 mg/dL (ref >39)
LDL Chol Calc (NIH): 112 mg/dL — ABNORMAL HIGH (ref 0–99)
Triglycerides: 290 mg/dL — ABNORMAL HIGH (ref 0–149)
VLDL Cholesterol Cal: 50 mg/dL — ABNORMAL HIGH (ref 5–40)

## 2022-09-26 IMAGING — CR DG CHEST 2V
2 series · 2 of 2 positions shown · non-contrast
Comparison: None.

CLINICAL DATA: Short of breath tachycardia

EXAM:
CHEST - 2 VIEW

[chest pa]
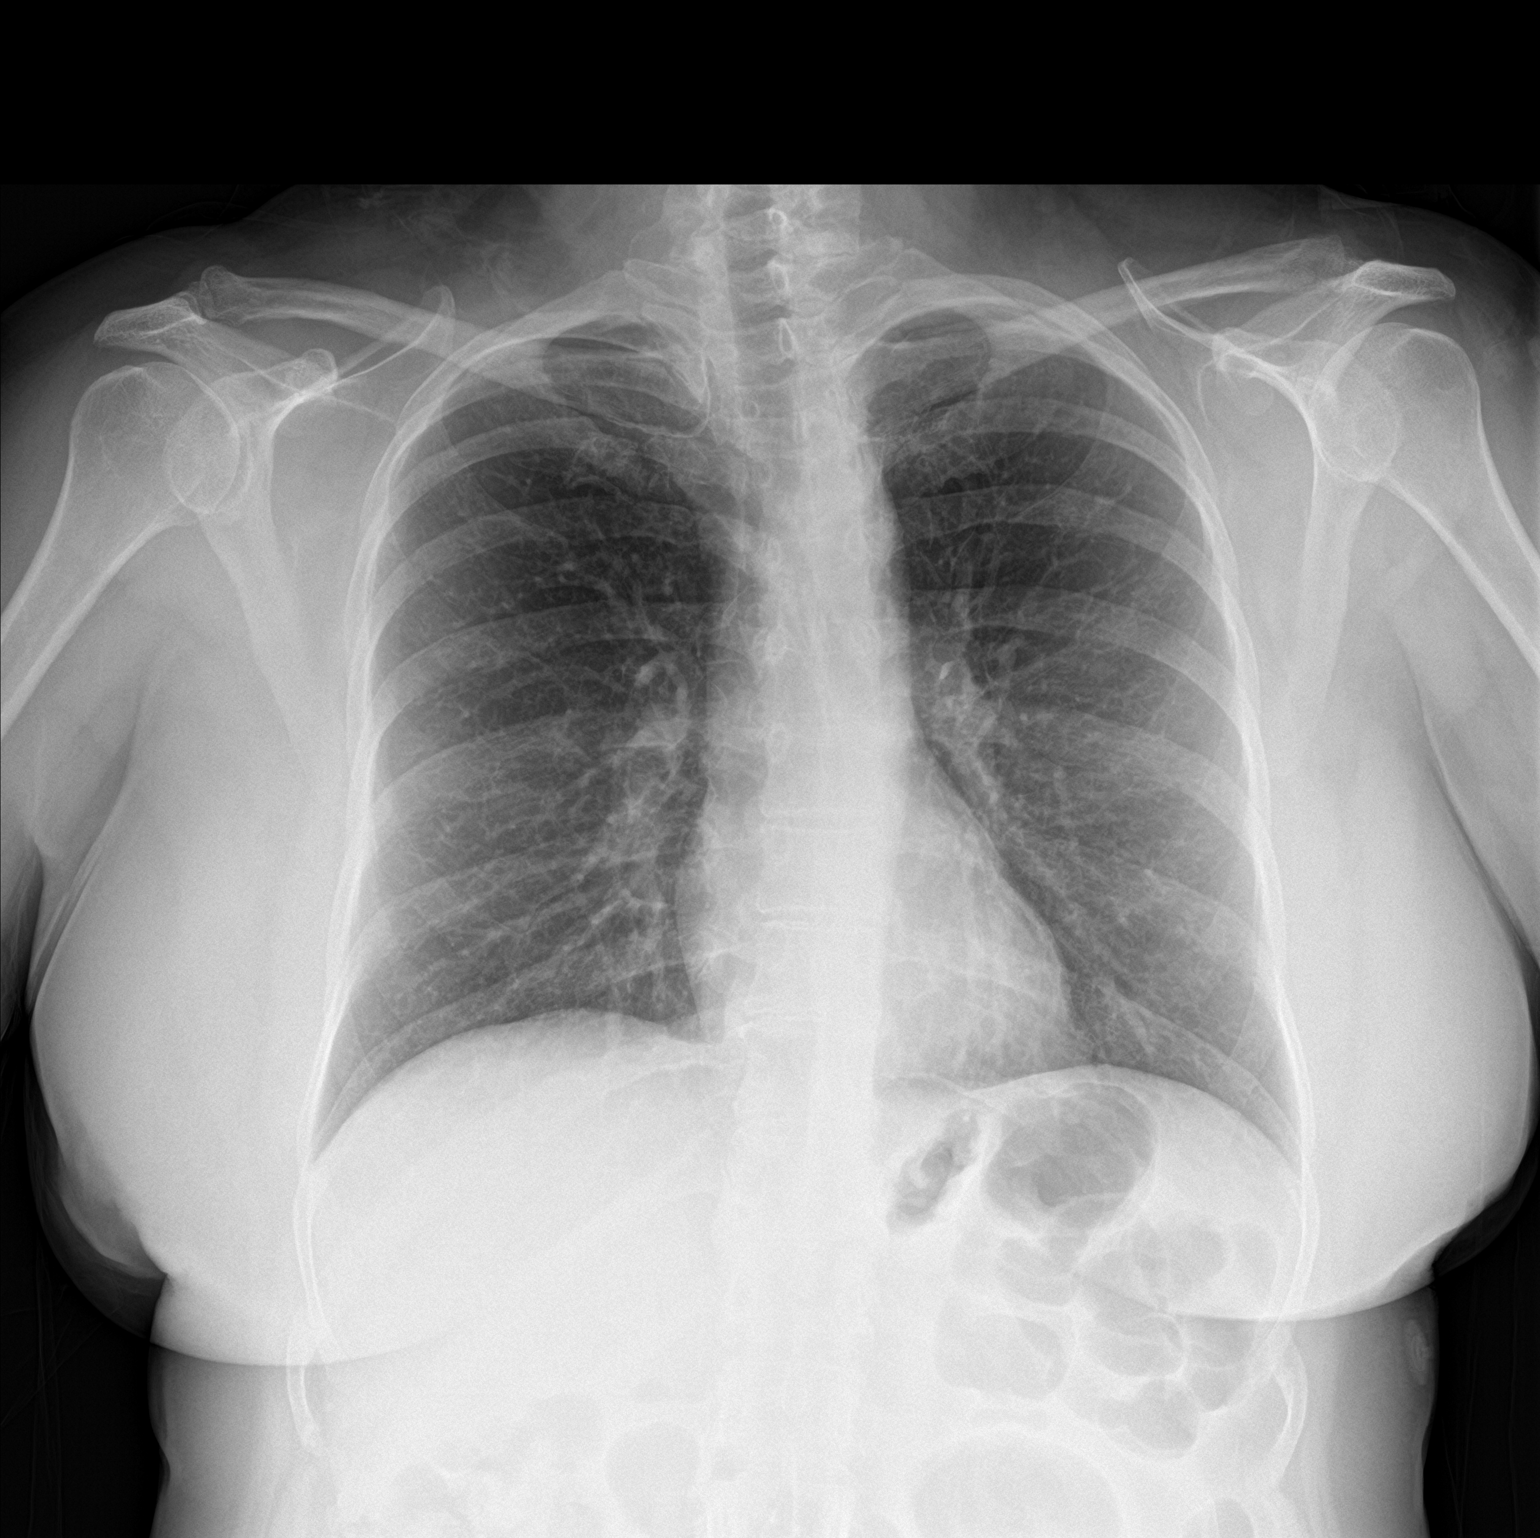

[chest lat]
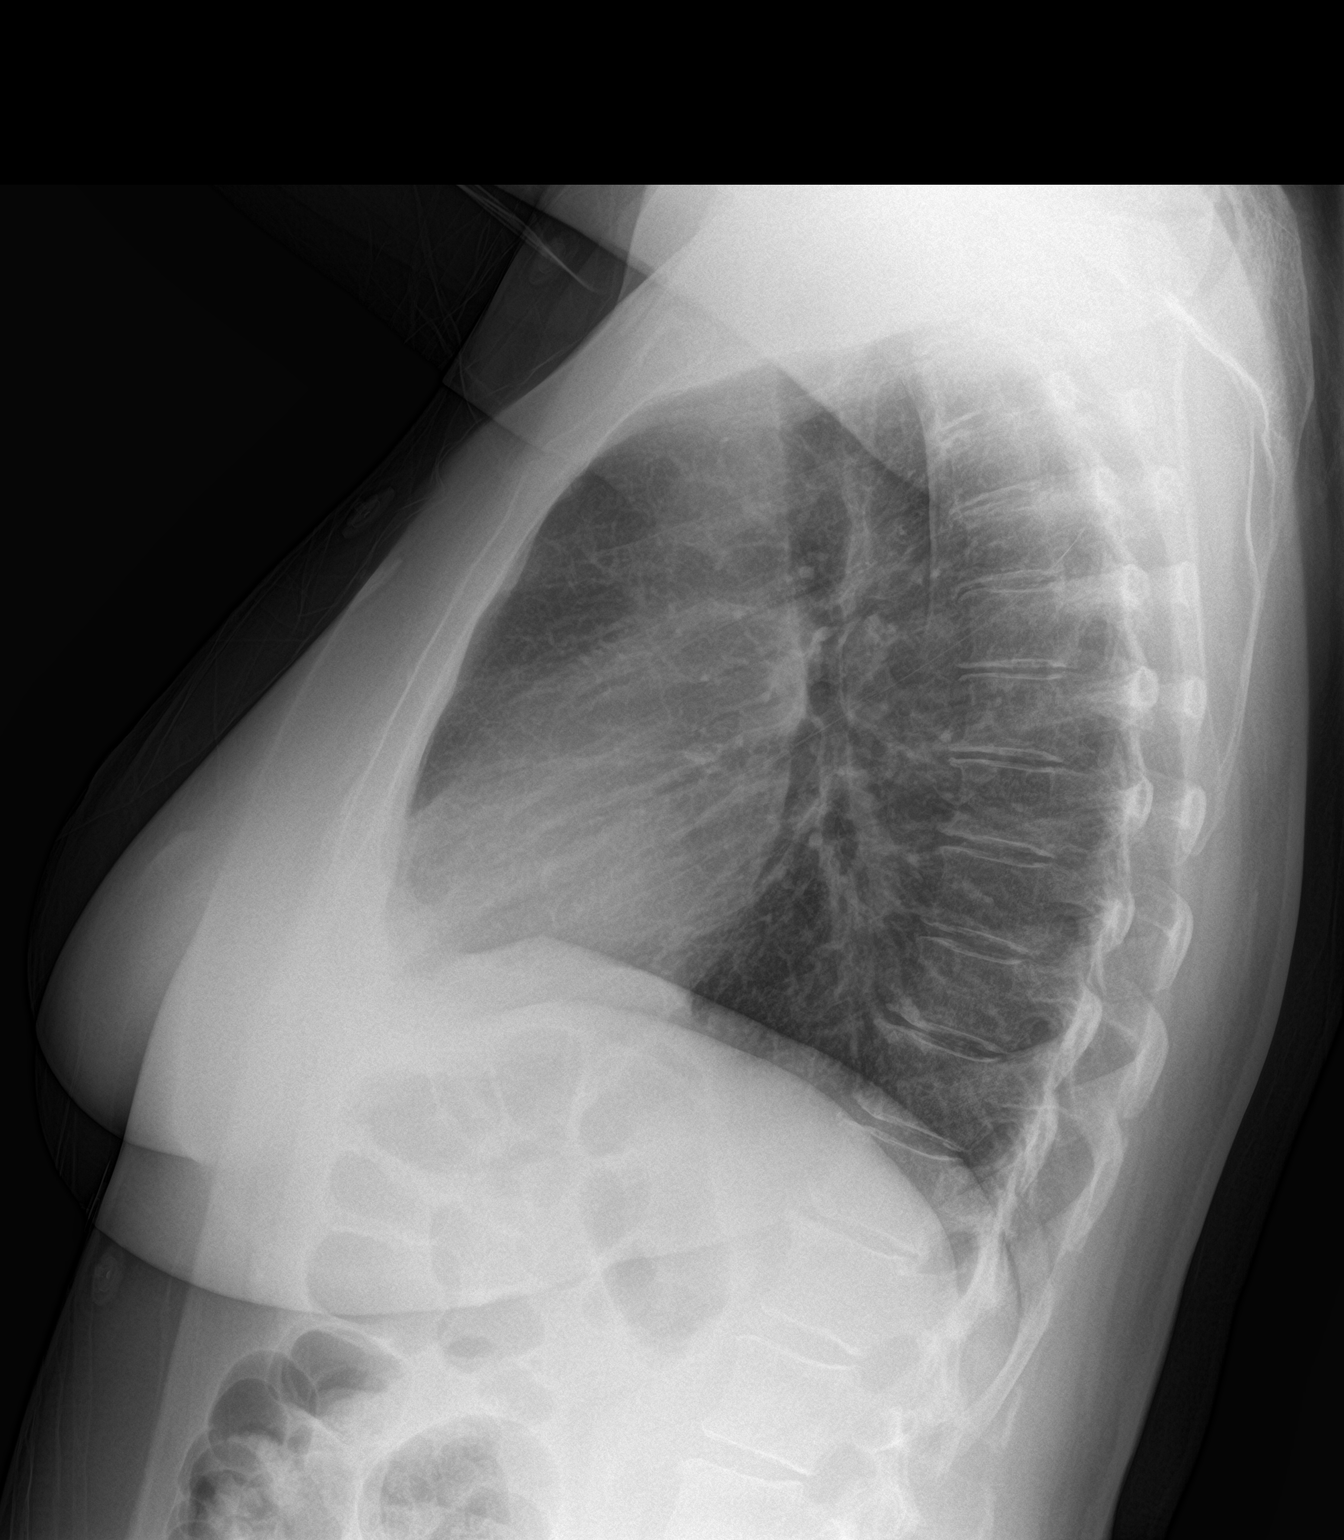

[2 of 2 positions shown; findings below may reference images not displayed]

FINDINGS: The heart size and mediastinal contours are within normal limits.
Both lungs are clear. The visualized skeletal structures are
unremarkable.
IMPRESSION: No active cardiopulmonary disease.

## 2023-02-07 ENCOUNTER — Ambulatory Visit: Payer: 59 | Admitting: Nurse Practitioner

## 2023-03-18 ENCOUNTER — Other Ambulatory Visit: Payer: Self-pay | Admitting: Nurse Practitioner

## 2023-03-18 DIAGNOSIS — F323 Major depressive disorder, single episode, severe with psychotic features: Secondary | ICD-10-CM

## 2023-03-18 DIAGNOSIS — I10 Essential (primary) hypertension: Secondary | ICD-10-CM

## 2023-03-18 DIAGNOSIS — R519 Headache, unspecified: Secondary | ICD-10-CM

## 2023-03-18 DIAGNOSIS — E781 Pure hyperglyceridemia: Secondary | ICD-10-CM

## 2023-03-19 ENCOUNTER — Encounter: Payer: Self-pay | Admitting: Nurse Practitioner

## 2023-03-19 NOTE — Telephone Encounter (Signed)
MMM NTBS for 6 mos FU which was to be in Feb. RF was sent to mail order.

## 2023-03-19 NOTE — Telephone Encounter (Signed)
LMTCB. Letter mailed ?

## 2023-05-27 ENCOUNTER — Other Ambulatory Visit: Payer: Self-pay | Admitting: Nurse Practitioner

## 2023-05-27 DIAGNOSIS — R519 Headache, unspecified: Secondary | ICD-10-CM

## 2023-05-27 DIAGNOSIS — E781 Pure hyperglyceridemia: Secondary | ICD-10-CM

## 2023-05-27 DIAGNOSIS — F323 Major depressive disorder, single episode, severe with psychotic features: Secondary | ICD-10-CM

## 2023-05-27 DIAGNOSIS — I10 Essential (primary) hypertension: Secondary | ICD-10-CM

## 2024-04-28 ENCOUNTER — Encounter (HOSPITAL_COMMUNITY): Payer: Self-pay

## 2024-04-28 ENCOUNTER — Emergency Department (HOSPITAL_COMMUNITY)
Admission: EM | Admit: 2024-04-28 | Discharge: 2024-04-28 | Disposition: A | Discharge status: Home / Self Care | Attending: Emergency Medicine | Admitting: Emergency Medicine

## 2024-04-28 ENCOUNTER — Emergency Department (HOSPITAL_COMMUNITY)

## 2024-04-28 ENCOUNTER — Other Ambulatory Visit: Payer: Self-pay

## 2024-04-28 DIAGNOSIS — I10 Essential (primary) hypertension: Secondary | ICD-10-CM | POA: Insufficient documentation

## 2024-04-28 DIAGNOSIS — Z79899 Other long term (current) drug therapy: Secondary | ICD-10-CM | POA: Diagnosis not present

## 2024-04-28 DIAGNOSIS — R0781 Pleurodynia: Secondary | ICD-10-CM | POA: Diagnosis present

## 2024-04-28 HISTORY — DX: Disorder of kidney and ureter, unspecified: N28.9

## 2024-04-28 LAB — COMPREHENSIVE METABOLIC PANEL WITH GFR
ALT: 27 U/L (ref 0–44)
AST: 23 U/L (ref 15–41)
Albumin: 4.2 g/dL (ref 3.5–5.0)
Alkaline Phosphatase: 56 U/L (ref 38–126)
Anion gap: 9 (ref 5–15)
BUN: 19 mg/dL (ref 6–20)
CO2: 24 mmol/L (ref 22–32)
Calcium: 10.5 mg/dL — ABNORMAL HIGH (ref 8.9–10.3)
Chloride: 104 mmol/L (ref 98–111)
Creatinine, Ser: 0.92 mg/dL (ref 0.44–1.00)
GFR, Estimated: >60 mL/min (ref >60)
Glucose, Bld: 113 mg/dL — ABNORMAL HIGH (ref 70–99)
Potassium: 4 mmol/L (ref 3.5–5.1)
Sodium: 137 mmol/L (ref 135–145)
Total Bilirubin: 0.8 mg/dL (ref 0.0–1.2)
Total Protein: 7.1 g/dL (ref 6.5–8.1)

## 2024-04-28 LAB — I-STAT CHEM 8, ED
BUN: 20 mg/dL (ref 6–20)
Calcium, Ion: 1.26 mmol/L (ref 1.15–1.40)
Chloride: 106 mmol/L (ref 98–111)
Creatinine, Ser: 0.9 mg/dL (ref 0.44–1.00)
Glucose, Bld: 103 mg/dL — ABNORMAL HIGH (ref 70–99)
HCT: 38 % (ref 36.0–46.0)
Hemoglobin: 12.9 g/dL (ref 12.0–15.0)
Potassium: 3.9 mmol/L (ref 3.5–5.1)
Sodium: 139 mmol/L (ref 135–145)
TCO2: 22 mmol/L (ref 22–32)

## 2024-04-28 LAB — CBC
HCT: 38.1 % (ref 36.0–46.0)
Hemoglobin: 12.8 g/dL (ref 12.0–15.0)
MCH: 30.5 pg (ref 26.0–34.0)
MCHC: 33.6 g/dL (ref 30.0–36.0)
MCV: 90.9 fL (ref 80.0–100.0)
Platelets: 284 10*3/uL (ref 150–400)
RBC: 4.19 MIL/uL (ref 3.87–5.11)
RDW: 13 % (ref 11.5–15.5)
WBC: 9.4 10*3/uL (ref 4.0–10.5)
nRBC: 0 % (ref 0.0–0.2)

## 2024-04-28 LAB — URINALYSIS, ROUTINE W REFLEX MICROSCOPIC
Bilirubin Urine: NEGATIVE
Glucose, UA: NEGATIVE mg/dL
Hgb urine dipstick: NEGATIVE
Ketones, ur: NEGATIVE mg/dL
Leukocytes,Ua: NEGATIVE
Nitrite: NEGATIVE
Protein, ur: NEGATIVE mg/dL
Specific Gravity, Urine: 1.017 (ref 1.005–1.030)
pH: 7 (ref 5.0–8.0)

## 2024-04-28 LAB — TROPONIN I (HIGH SENSITIVITY)
Troponin I (High Sensitivity): 2 ng/L (ref <18)
Troponin I (High Sensitivity): <2 ng/L (ref <18)

## 2024-04-28 LAB — LIPASE, BLOOD: Lipase: 32 U/L (ref 11–51)

## 2024-04-28 MED ORDER — KETOROLAC TROMETHAMINE 10 MG PO TABS: ORAL_TABLET | ORAL | 0 refills | Status: AC

## 2024-04-28 MED ORDER — KETOROLAC TROMETHAMINE 30 MG/ML IJ SOLN
15.0000 mg | Freq: Once | INTRAMUSCULAR | Status: AC
  Administered 2024-04-28: 15 mg via INTRAVENOUS
  Filled 2024-04-28: qty 1

## 2024-04-28 MED ORDER — LEVOFLOXACIN 750 MG PO TABS
750.0000 mg | ORAL_TABLET | Freq: Once | ORAL | Status: AC
  Administered 2024-04-28: 750 mg via ORAL
  Filled 2024-04-28: qty 1

## 2024-04-28 MED ORDER — LEVOFLOXACIN 750 MG PO TABS: 750.0000 mg | ORAL_TABLET | Freq: Every day | ORAL | 0 refills | Status: AC

## 2024-04-28 MED ORDER — IOHEXOL 350 MG/ML SOLN
75.0000 mL | Freq: Once | INTRAVENOUS | Status: AC | PRN
  Administered 2024-04-28: 75 mL via INTRAVENOUS

## 2024-04-28 MED ORDER — HYDROMORPHONE HCL 1 MG/ML IJ SOLN
0.5000 mg | Freq: Once | INTRAMUSCULAR | Status: AC
  Administered 2024-04-28: 0.5 mg via INTRAVENOUS
  Filled 2024-04-28: qty 0.5

## 2024-04-28 NOTE — ED Triage Notes (Signed)
 Pt arrived via POV c/o left flank pain that Pt reports she noticed last night. Pt denies N/V/D, denies constipation, Pt denies injury, and reports pain feels sharp.

## 2024-04-28 NOTE — ED Provider Notes (Signed)
 [place] EMERGENCY DEPARTMENT AT [facility] Provider Note   CSN: [id number] Arrival date & time: 04/28/24  1721     History {Add pertinent medical, surgical, social history, OB history to HPI:1} Chief Complaint  Patient presents with   Flank Pain    Kayla Vazquez is a 58 y.o. female.  Patient with pleuritic chest pain on the right.  The pain started last night.  She has history of hypertension   Flank Pain       Home Medications Prior to Admission medications   Medication Sig Start Date End Date Taking? Authorizing Provider  ketorolac  (TORADOL ) 10 MG tablet Take 1 every 6 hours for pain not relieved by Tylenol  alone 04/28/24  Yes Cheyenne Cotta, MD  levofloxacin  (LEVAQUIN ) 750 MG tablet Take 1 tablet (750 mg total) by mouth daily. X 7 days 04/28/24  Yes Marshay Slates, MD  buPROPion  (WELLBUTRIN  XL) 300 MG 24 hr tablet TAKE 1 TABLET BY MOUTH DAILY 03/19/23   Gaylyn Keas, Mary-Margaret, FNP  escitalopram  (LEXAPRO ) 10 MG tablet Take 1 tablet (10 mg total) by mouth daily. 08/07/22   Delfina Feller, FNP  fenofibrate  (TRICOR ) 145 MG tablet TAKE 1 TABLET BY MOUTH DAILY 03/19/23   Delfina Feller, FNP  lisinopril  (ZESTRIL ) 20 MG tablet TAKE 1 TABLET BY MOUTH DAILY 03/19/23   Delfina Feller, FNP  pantoprazole  (PROTONIX ) 40 MG tablet Take 1 tablet (40 mg total) by mouth daily before breakfast. 07/04/21   Tobin Forts, MD  propranolol  ER (INDERAL  LA) 60 MG 24 hr capsule TAKE 1 CAPSULE BY MOUTH DAILY 03/19/23   Delfina Feller, FNP      Allergies    Penicillins and Demerol [meperidine]    Review of Systems   Review of Systems  Genitourinary:  Positive for flank pain.    Physical Exam Updated Vital Signs BP 127/73   Pulse 86   Temp 99.6 F (37.6 C) (Oral)   Resp 14   Ht 5\' 2"  (1.575 m)   Wt 74.5 kg   SpO2 94%   BMI 30.04 kg/m  Physical Exam  ED Results / Procedures / Treatments   Labs (all labs ordered are listed, but only abnormal results are  displayed) Labs Reviewed  COMPREHENSIVE METABOLIC PANEL WITH GFR - Abnormal; Notable for the following components:      Result Value   Glucose, Bld 113 (*)    Calcium  10.5 (*)    All other components within normal limits  I-STAT CHEM 8, ED - Abnormal; Notable for the following components:   Glucose, Bld 103 (*)    All other components within normal limits  LIPASE, BLOOD  CBC  URINALYSIS, ROUTINE W REFLEX MICROSCOPIC  TROPONIN I (HIGH SENSITIVITY)  TROPONIN I (HIGH SENSITIVITY)    EKG None  Radiology CT Angio Chest PE W and/or Wo Contrast Result Date: 04/28/2024 CLINICAL DATA:  Left-sided chest pain. Concern for pulmonary embolism. EXAM: CT ANGIOGRAPHY CHEST WITH CONTRAST TECHNIQUE: Multidetector CT imaging of the chest was performed using the standard protocol during bolus administration of intravenous contrast. Multiplanar CT image reconstructions and MIPs were obtained to evaluate the vascular anatomy. RADIATION DOSE REDUCTION: This exam was performed according to the departmental dose-optimization program which includes automated exposure control, adjustment of the mA and/or kV according to patient size and/or use of iterative reconstruction technique. CONTRAST:  75mL OMNIPAQUE IOHEXOL 350 MG/ML SOLN COMPARISON:  None Available. FINDINGS: Cardiovascular: There is no cardiomegaly or pericardial effusion. The thoracic aorta is unremarkable. The origins of  the great vessels of the aortic arch appear patent. No pulmonary artery embolus identified. Mediastinum/Nodes: No hilar or mediastinal adenopathy. The esophagus is grossly unremarkable. No mediastinal fluid collection. Lungs/Pleura: A 2.5 x 3.0 cm area of consolidation at the left lung base may represent pneumonia. Underlying mass or infarct is not excluded. Clinical correlation and follow-up to resolution recommended. No pleural effusion or pneumothorax. The central airways are patent. Upper Abdomen: Fatty liver. Musculoskeletal: Osteopenia  with degenerative changes of the spine. No acute osseous pathology. Review of the MIP images confirms the above findings. IMPRESSION: 1. No CT evidence of pulmonary embolism. 2. Left lower lobe consolidation may represent pneumonia. Underlying mass or infarct is not excluded. Clinical correlation and follow-up to resolution recommended. 3. Fatty liver. Electronically Signed   By: Angus Bark M.D.   On: 04/28/2024 19:47   DG Chest 2 View Result Date: 04/28/2024 CLINICAL DATA:  Left flank pain. EXAM: CHEST - 2 VIEW COMPARISON:  May 17, 2021 FINDINGS: The heart size and mediastinal contours are within normal limits. Both lungs are clear. The visualized skeletal structures are unremarkable. IMPRESSION: No active cardiopulmonary disease. Electronically Signed   By: Virgle Grime M.D.   On: 04/28/2024 18:09    Procedures Procedures  {Document cardiac monitor, telemetry assessment procedure when appropriate:1}  Medications Ordered in ED Medications  levofloxacin  (LEVAQUIN ) tablet 750 mg (has no administration in time range)  HYDROmorphone  (DILAUDID ) injection 0.5 mg (0.5 mg Intravenous Given 04/28/24 1803)  iohexol (OMNIPAQUE) 350 MG/ML injection 75 mL (75 mLs Intravenous Contrast Given 04/28/24 1936)  ketorolac  (TORADOL ) 30 MG/ML injection 15 mg (15 mg Intravenous Given 04/28/24 2024)    ED Course/ Medical Decision Making/ A&P  I spoke with Dr. Bernetta Brilliant pulmonologist and he recommended pain control with Toradol  and antibiotics for possible pneumonia with follow-up as an outpatient with pulmonary {   Click here for ABCD2, HEART and other calculatorsREFRESH Note before signing :1}                              Medical Decision Making Amount and/or Complexity of Data Reviewed Labs: ordered. Radiology: ordered. ECG/medicine tests: ordered.  Risk Prescription drug management.   Pleuritic chest pain, possible pneumonia.  Patient will be started on Levaquin  and follow-up with  pulmonary  {Document critical care time when appropriate:1} {Document review of labs and clinical decision tools ie heart score, Chads2Vasc2 etc:1}  {Document your independent review of radiology images, and any outside records:1} {Document your discussion with family members, caretakers, and with consultants:1} {Document social determinants of health affecting pt's care:1} {Document your decision making why or why not admission, treatments were needed:1} Final Clinical Impression(s) / ED Diagnoses Final diagnoses:  Pleuritic chest pain    Rx / DC Orders ED Discharge Orders          Ordered    levofloxacin  (LEVAQUIN ) 750 MG tablet  Daily        04/28/24 2025    ketorolac  (TORADOL ) 10 MG tablet        04/28/24 2025

## 2024-04-28 NOTE — Discharge Instructions (Addendum)
 Follow-up with [place] pulmonary next week.  You have been referred to Dr. Waymond Hailey but you can see any of the pulmonologist there.  return sooner if problems
